# Patient Record
Sex: Female | Born: 1965
Health system: Southern US, Community
[De-identification: ages and names within clinical notes are randomized; demographics above are authoritative.]

## PROBLEM LIST (undated history)

## (undated) DIAGNOSIS — C801 Malignant (primary) neoplasm, unspecified: Secondary | ICD-10-CM

## (undated) DIAGNOSIS — E079 Disorder of thyroid, unspecified: Secondary | ICD-10-CM

## (undated) DIAGNOSIS — I10 Essential (primary) hypertension: Secondary | ICD-10-CM

## (undated) HISTORY — PX: THYROIDECTOMY: SHX17

## (undated) HISTORY — PX: APPENDECTOMY: SHX54

---

## 2003-06-07 ENCOUNTER — Emergency Department (HOSPITAL_COMMUNITY): Admission: EM | Admit: 2003-06-07 | Discharge: 2003-06-07 | Payer: Self-pay | Admitting: Emergency Medicine

## 2007-03-02 ENCOUNTER — Ambulatory Visit (HOSPITAL_COMMUNITY): Admission: RE | Admit: 2007-03-02 | Discharge: 2007-03-02 | Payer: Self-pay | Admitting: Obstetrics and Gynecology

## 2007-04-01 ENCOUNTER — Ambulatory Visit (HOSPITAL_COMMUNITY): Admission: RE | Admit: 2007-04-01 | Discharge: 2007-04-01 | Payer: Self-pay | Admitting: Obstetrics and Gynecology

## 2007-07-01 ENCOUNTER — Ambulatory Visit (HOSPITAL_COMMUNITY): Admission: RE | Admit: 2007-07-01 | Discharge: 2007-07-01 | Payer: Self-pay | Admitting: Pulmonary Disease

## 2007-07-01 ENCOUNTER — Ambulatory Visit (HOSPITAL_COMMUNITY): Admission: RE | Admit: 2007-07-01 | Discharge: 2007-07-01 | Payer: Self-pay | Admitting: Obstetrics and Gynecology

## 2008-04-07 ENCOUNTER — Other Ambulatory Visit: Admission: RE | Admit: 2008-04-07 | Discharge: 2008-04-07 | Payer: Self-pay | Admitting: Obstetrics and Gynecology

## 2008-04-08 ENCOUNTER — Ambulatory Visit (HOSPITAL_COMMUNITY): Admission: RE | Admit: 2008-04-08 | Discharge: 2008-04-08 | Payer: Self-pay | Admitting: Obstetrics & Gynecology

## 2008-04-18 ENCOUNTER — Ambulatory Visit (HOSPITAL_COMMUNITY): Admission: RE | Admit: 2008-04-18 | Discharge: 2008-04-18 | Payer: Self-pay | Admitting: Obstetrics & Gynecology

## 2008-04-25 ENCOUNTER — Encounter (HOSPITAL_COMMUNITY): Admission: RE | Admit: 2008-04-25 | Discharge: 2008-05-25 | Payer: Self-pay | Admitting: Endocrinology

## 2008-06-01 ENCOUNTER — Other Ambulatory Visit: Payer: Self-pay

## 2008-06-01 ENCOUNTER — Ambulatory Visit: Payer: Self-pay | Admitting: Otolaryngology

## 2008-06-09 ENCOUNTER — Ambulatory Visit: Payer: Self-pay | Admitting: Otolaryngology

## 2008-07-19 ENCOUNTER — Inpatient Hospital Stay: Payer: Self-pay | Admitting: Endocrinology

## 2009-12-18 ENCOUNTER — Encounter (HOSPITAL_COMMUNITY): Admission: RE | Admit: 2009-12-18 | Discharge: 2010-02-19 | Payer: Self-pay | Admitting: Endocrinology

## 2010-08-07 ENCOUNTER — Ambulatory Visit (HOSPITAL_COMMUNITY): Admission: RE | Admit: 2010-08-07 | Discharge: 2010-08-07 | Payer: Self-pay | Admitting: Pulmonary Disease

## 2011-03-27 ENCOUNTER — Other Ambulatory Visit (HOSPITAL_COMMUNITY): Payer: Self-pay | Admitting: "Endocrinology

## 2011-03-27 DIAGNOSIS — Z85850 Personal history of malignant neoplasm of thyroid: Secondary | ICD-10-CM

## 2011-03-28 ENCOUNTER — Other Ambulatory Visit (HOSPITAL_COMMUNITY): Payer: Self-pay | Admitting: "Endocrinology

## 2011-03-28 DIAGNOSIS — C73 Malignant neoplasm of thyroid gland: Secondary | ICD-10-CM

## 2011-04-03 ENCOUNTER — Encounter (HOSPITAL_COMMUNITY)
Admission: RE | Admit: 2011-04-03 | Discharge: 2011-04-03 | Disposition: A | Discharge status: Home / Self Care | Payer: 59 | Source: Ambulatory Visit | Attending: "Endocrinology | Admitting: "Endocrinology

## 2011-04-03 ENCOUNTER — Encounter (HOSPITAL_COMMUNITY): Payer: Self-pay

## 2011-04-03 DIAGNOSIS — Z09 Encounter for follow-up examination after completed treatment for conditions other than malignant neoplasm: Secondary | ICD-10-CM | POA: Insufficient documentation

## 2011-04-03 DIAGNOSIS — C73 Malignant neoplasm of thyroid gland: Secondary | ICD-10-CM

## 2011-04-03 DIAGNOSIS — Z85850 Personal history of malignant neoplasm of thyroid: Secondary | ICD-10-CM | POA: Insufficient documentation

## 2011-04-03 HISTORY — DX: Malignant (primary) neoplasm, unspecified: C80.1

## 2011-04-03 HISTORY — DX: Essential (primary) hypertension: I10

## 2011-04-03 MED ORDER — SODIUM IODIDE I 131 CAPSULE: 3.0000 | Freq: Once | INTRAVENOUS | Status: DC | PRN

## 2011-04-04 ENCOUNTER — Ambulatory Visit (HOSPITAL_COMMUNITY): Admission: RE | Admit: 2011-04-04 | Payer: 59 | Source: Ambulatory Visit

## 2011-04-05 ENCOUNTER — Encounter (HOSPITAL_COMMUNITY): Payer: 59

## 2011-04-05 ENCOUNTER — Encounter (HOSPITAL_COMMUNITY)
Admission: RE | Admit: 2011-04-05 | Discharge: 2011-04-05 | Disposition: A | Discharge status: Home / Self Care | Payer: 59 | Source: Ambulatory Visit | Attending: "Endocrinology | Admitting: "Endocrinology

## 2011-04-05 DIAGNOSIS — Z85850 Personal history of malignant neoplasm of thyroid: Secondary | ICD-10-CM

## 2011-04-08 ENCOUNTER — Encounter (HOSPITAL_COMMUNITY)
Admission: RE | Admit: 2011-04-08 | Discharge: 2011-04-08 | Payer: 59 | Source: Ambulatory Visit | Attending: "Endocrinology | Admitting: "Endocrinology

## 2011-04-08 MED ORDER — SODIUM IODIDE I 131 CAPSULE: 3.0000 | Freq: Once | INTRAVENOUS | Status: AC | PRN

## 2011-05-03 NOTE — Procedures (Signed)
   NAME:  Dana White, Dana White                     ACCOUNT NO.:  [id number]   MEDICAL RECORD NO.:  [id number]                   PATIENT TYPE:  EMS   LOCATION:  ED                                   FACILITY:  [facility]   PHYSICIAN:  Edward L. Juanetta Gosling, M.D.             DATE OF BIRTH:  [birth year]   DATE OF PROCEDURE:  DATE OF DISCHARGE:  06/07/2003                                EKG INTERPRETATION   ELECTROCARDIOGRAM INTERPRETATION:  At 11:51 on June 07, 2003.  The rhythm is  sinus rhythm with a rate in the 70s.  There are nonspecific ST/T wave  changes inferiorly.  Abnormal electrocardiogram.                                                Oneal Deputy. Juanetta Gosling, M.D.    ELH/MEDQ  D:  06/22/2003  T:  06/23/2003  Job:  [id number]

## 2012-04-22 ENCOUNTER — Emergency Department (HOSPITAL_COMMUNITY)
Admission: EM | Admit: 2012-04-22 | Discharge: 2012-04-22 | Disposition: A | Discharge status: Home / Self Care | Payer: 59 | Attending: Emergency Medicine | Admitting: Emergency Medicine

## 2012-04-22 ENCOUNTER — Encounter (HOSPITAL_COMMUNITY): Payer: Self-pay

## 2012-04-22 DIAGNOSIS — F17200 Nicotine dependence, unspecified, uncomplicated: Secondary | ICD-10-CM | POA: Insufficient documentation

## 2012-04-22 DIAGNOSIS — R05 Cough: Secondary | ICD-10-CM | POA: Insufficient documentation

## 2012-04-22 DIAGNOSIS — K5900 Constipation, unspecified: Secondary | ICD-10-CM | POA: Insufficient documentation

## 2012-04-22 DIAGNOSIS — R42 Dizziness and giddiness: Secondary | ICD-10-CM

## 2012-04-22 DIAGNOSIS — R059 Cough, unspecified: Secondary | ICD-10-CM | POA: Insufficient documentation

## 2012-04-22 DIAGNOSIS — R0602 Shortness of breath: Secondary | ICD-10-CM | POA: Insufficient documentation

## 2012-04-22 DIAGNOSIS — R002 Palpitations: Secondary | ICD-10-CM | POA: Insufficient documentation

## 2012-04-22 HISTORY — DX: Disorder of thyroid, unspecified: E07.9

## 2012-04-22 LAB — DIFFERENTIAL
Basophils Relative: 0 % (ref 0–1)
Eosinophils Absolute: 0.2 10*3/uL (ref 0.0–0.7)
Eosinophils Relative: 2 % (ref 0–5)
Lymphocytes Relative: 34 % (ref 12–46)
Monocytes Absolute: 0.6 10*3/uL (ref 0.1–1.0)
Monocytes Relative: 6 % (ref 3–12)

## 2012-04-22 LAB — TSH: TSH: 0.682 u[IU]/mL (ref 0.350–4.500)

## 2012-04-22 LAB — CBC
Hemoglobin: 15.3 g/dL — ABNORMAL HIGH (ref 12.0–15.0)
MCHC: 34.3 g/dL (ref 30.0–36.0)
MCV: 90.1 fL (ref 78.0–100.0)
RBC: 4.95 MIL/uL (ref 3.87–5.11)
WBC: 10.6 10*3/uL — ABNORMAL HIGH (ref 4.0–10.5)

## 2012-04-22 LAB — COMPREHENSIVE METABOLIC PANEL
ALT: 16 U/L (ref 0–35)
AST: 16 U/L (ref 0–37)
Calcium: 10.3 mg/dL (ref 8.4–10.5)
Chloride: 100 mEq/L (ref 96–112)
GFR calc Af Amer: >90 mL/min (ref >90)
GFR calc non Af Amer: >90 mL/min (ref >90)
Sodium: 138 mEq/L (ref 135–145)

## 2012-04-22 LAB — T4, FREE: Free T4: 1.78 ng/dL (ref 0.80–1.80)

## 2012-04-22 LAB — POCT I-STAT TROPONIN I

## 2012-04-22 MED ORDER — MECLIZINE HCL 25 MG PO TABS: ORAL_TABLET | ORAL | Status: DC

## 2012-04-22 MED ORDER — MECLIZINE HCL 12.5 MG PO TABS
25.0000 mg | ORAL_TABLET | Freq: Once | ORAL | Status: AC
  Administered 2012-04-22: 25 mg via ORAL
  Filled 2012-04-22: qty 2

## 2012-04-22 NOTE — ED Notes (Addendum)
Pt c/o dizziness since Friday.  Also says feels like heart beat is "off."  Says heart will beat normally then feels a hard beat.  Reports had thyroidectomy and her PCP decreased her thyroid medication in Jan.  Also says arms and legs feel tingly.

## 2012-04-22 NOTE — ED Provider Notes (Signed)
History   This chart was scribed for Ward Givens, MD by Sofie Rower. The patient was seen in room [facility]12[facility]12 and the patient's care was started at 7:52 AM     CSN: [id number]  Arrival date & time 04/22/12  4098   First MD Initiated Contact with Patient 04/22/12 [phone]      Chief Complaint  Patient presents with  . Dizziness    (Consider location/radiation/quality/duration/timing/severity/associated sxs/prior treatment) HPI  Dana White is a 46 y.o. female who presents to the Emergency Department complaining of dizziness onset five days ago with associated symptoms sinus tenderness, cough, shortness of breath, constipation, hair falling out. The pt states she was driving to work Friday morning, where she began to feel dizzy. Pt informs EDP that "the episodes last for a few seconds." The pt states "I feel like my heartbeat is abnormal." She states she feels her heart beating normally in she feels a hard beat then a pause and it goes to normal.  The pt also states she is having hot flashes which she's had for a while with menopause. Modifying factors include certain body positions and quick movements of the head which intensify the dizziness. Pt has a hx visiting with Endocrinologist in January, 2013 where her thyroid medication was lowered from 168 to 150 mcg a day because her TSH was 1.9 and her endocrinologist wanted her to be 1.8. Her next appointment is in July. She has not had her thyroid level checked since the dose was changed.. Patient states she started not feeling well and about a month after her dose of thyroid supplement was changed Pt has a hx of a tick being removed by her husband a few weejs ago without fever, lesions or joint pains. Pt also has a hx of papillary thyroid cancer for which she has a hx of surgery four years ago this June, familial hx of breast cancer (mothers sister), and a hx of acid reflux disease.    Pt denies headache, chest pain, shortness of breath, familial  hx of heart disease, nausea, vomiting, diarrhea fever.   Pt is a smoker.   PCP is Dr. Juanetta Gosling  Endocrinologist is Dr. Fransico Him.    Past Medical History  Diagnosis Date  . Hypertension   . Cancer     papillary  . Thyroid cancer     Past Surgical History  Procedure Date  . Thyroidectomy      History  Substance Use Topics  . Smoking status: Current Everyday Smoker  . Smokeless tobacco: Not on file  . Alcohol Use: No   employed Lives with spouse at home  OB History    Grav Para Term Preterm Abortions TAB SAB Ect Mult Living                  Review of Systems  All other systems reviewed and are negative.    10 Systems reviewed and all are negative for acute change except as noted in the HPI.    Allergies  Review of patient's allergies indicates no known allergies.  Home Medications   Current Outpatient Rx  Name Route Sig Dispense Refill  . DEXLANSOPRAZOLE 30 MG PO CPDR Oral Take 30 mg by mouth daily.    Marland Kitchen LEVOTHYROXINE SODIUM 150 MCG PO TABS Oral Take 150 mcg by mouth daily.    Marland Kitchen LISINOPRIL 5 MG PO TABS Oral Take 5 mg by mouth daily.      BP 129/90  Pulse 92  Temp(Src) 98.9  F (37.2 C) (Oral)  Resp 18  Ht 5' (1.524 m)  Wt 142 lb (64.411 kg)  BMI 27.73 kg/m2  SpO2 98%  Vital signs normal    Physical Exam  Nursing note and vitals reviewed. Constitutional: She is oriented to person, place, and time. She appears well-developed and well-nourished. No distress.  HENT:  Head: Normocephalic and atraumatic.  Right Ear: External ear normal.  Left Ear: External ear normal.  Nose: Nose normal.  Mouth/Throat: Oropharynx is clear and moist.  Eyes: Conjunctivae and EOM are normal. Pupils are equal, round, and reactive to light.  Neck: Normal range of motion. Neck supple.  Cardiovascular: Normal rate, regular rhythm and normal heart sounds.   Pulmonary/Chest: Effort normal and breath sounds normal. No respiratory distress. She has no wheezes. She has no rales.   Musculoskeletal: Normal range of motion. She exhibits no edema and no tenderness.  Neurological: She is alert and oriented to person, place, and time.  Skin: Skin is warm and dry.  Psychiatric: She has a normal mood and affect. Her behavior is normal.    ED Course  Procedures (including critical care time)  Medications  meclizine (ANTIVERT) tablet 25 mg (25 mg Oral Given 04/22/12 0928)      8:05AM- EDP at bedside discusses treatment plan.   9:15AM- Recheck. EDP at bedside discusses treatment plan. Patient questions whether she should try meclizine over-the-counter to see if that would work. Patient was given meclizine 25 mg orally in the ER. Patient relieved that her cardiac tests were normal.  10:09 AM- Recheck. EDP at bedside discusses treatment plan concerning prescription for anivert, and thyroid testing. Pt has had the antivert and is feeling better. Sitting on the edge of her stretcher fully closed and appears to feel better.   DIAGNOSTIC STUDIES: Oxygen Saturation is 98% on room air, normal by my interpretation.    COORDINATION OF CARE:    Results for orders placed during the hospital encounter of 04/22/12  CBC      Component Value Range   WBC 10.6 (*) 4.0 - 10.5 (K/uL)   RBC 4.95  3.87 - 5.11 (MIL/uL)   Hemoglobin 15.3 (*) 12.0 - 15.0 (g/dL)   HCT 47.8  29.5 - 62.1 (%)   MCV 90.1  78.0 - 100.0 (fL)   MCH 30.9  26.0 - 34.0 (pg)   MCHC 34.3  30.0 - 36.0 (g/dL)   RDW 30.8  65.7 - 84.6 (%)   Platelets 295  150 - 400 (K/uL)  DIFFERENTIAL      Component Value Range   Neutrophils Relative 58  43 - 77 (%)   Neutro Abs 6.2  1.7 - 7.7 (K/uL)   Lymphocytes Relative 34  12 - 46 (%)   Lymphs Abs 3.6  0.7 - 4.0 (K/uL)   Monocytes Relative 6  3 - 12 (%)   Monocytes Absolute 0.6  0.1 - 1.0 (K/uL)   Eosinophils Relative 2  0 - 5 (%)   Eosinophils Absolute 0.2  0.0 - 0.7 (K/uL)   Basophils Relative 0  0 - 1 (%)   Basophils Absolute 0.0  0.0 - 0.1 (K/uL)  COMPREHENSIVE  METABOLIC PANEL      Component Value Range   Sodium 138  135 - 145 (mEq/L)   Potassium 4.0  3.5 - 5.1 (mEq/L)   Chloride 100  96 - 112 (mEq/L)   CO2 26  19 - 32 (mEq/L)   Glucose, Bld 97  70 - 99 (mg/dL)  BUN 8  6 - 23 (mg/dL)   Creatinine, Ser 1.61  0.50 - 1.10 (mg/dL)   Calcium 09.6  8.4 - 10.5 (mg/dL)   Total Protein 7.7  6.0 - 8.3 (g/dL)   Albumin 4.1  3.5 - 5.2 (g/dL)   AST 16  0 - 37 (U/L)   ALT 16  0 - 35 (U/L)   Alkaline Phosphatase 117  39 - 117 (U/L)   Total Bilirubin 0.2 (*) 0.3 - 1.2 (mg/dL)   GFR calc non Af Amer >90  >90 (mL/min)   GFR calc Af Amer >90  >90 (mL/min)  POCT I-STAT TROPONIN I      Component Value Range   Troponin i, poc 0.00  0.00 - 0.08 (ng/mL)   Comment 3            Laboratory interpretation all normal    Date: 04/22/2012  Rate: 78  Rhythm: normal sinus rhythm  QRS Axis: normal  Intervals: normal  ST/T Wave abnormalities: normal  Conduction Disutrbances:none  Narrative Interpretation:   Old EKG Reviewed: none available    1. Dizziness   2. Palpitations     Plan discharge  Devoria Albe, MD, FACEP    MDM   I personally performed the services described in this documentation, which was scribed in my presence. The recorded information has been reviewed and considered. Devoria Albe, MD, Armando Gang    Ward Givens, MD 04/22/12 [phone]

## 2012-04-22 NOTE — Discharge Instructions (Signed)
Take the Antivert as needed for dizziness. Call Dr. Isidoro Donning office to let them know you had thyroid test done today, and the results should be back in the next 2 days. Return to emergency department if you get chest pain, pass out, get headache, or you feel worse.

## 2013-04-20 ENCOUNTER — Other Ambulatory Visit (HOSPITAL_COMMUNITY): Payer: Self-pay | Admitting: "Endocrinology

## 2013-04-20 DIAGNOSIS — C73 Malignant neoplasm of thyroid gland: Secondary | ICD-10-CM

## 2013-04-22 ENCOUNTER — Ambulatory Visit (HOSPITAL_COMMUNITY): Payer: 59

## 2013-04-30 ENCOUNTER — Ambulatory Visit (HOSPITAL_COMMUNITY)
Admission: RE | Admit: 2013-04-30 | Discharge: 2013-04-30 | Disposition: A | Discharge status: Home / Self Care | Payer: 59 | Source: Ambulatory Visit | Attending: "Endocrinology | Admitting: "Endocrinology

## 2013-04-30 DIAGNOSIS — C73 Malignant neoplasm of thyroid gland: Secondary | ICD-10-CM

## 2014-04-22 ENCOUNTER — Other Ambulatory Visit (HOSPITAL_COMMUNITY): Payer: Self-pay | Admitting: "Endocrinology

## 2014-04-22 DIAGNOSIS — C73 Malignant neoplasm of thyroid gland: Secondary | ICD-10-CM

## 2014-05-11 ENCOUNTER — Encounter (HOSPITAL_COMMUNITY): Admission: RE | Admit: 2014-05-11 | Payer: 59 | Source: Ambulatory Visit

## 2014-05-12 ENCOUNTER — Encounter (HOSPITAL_COMMUNITY): Payer: 59

## 2014-05-13 ENCOUNTER — Encounter (HOSPITAL_COMMUNITY): Admission: RE | Admit: 2014-05-13 | Payer: 59 | Source: Ambulatory Visit

## 2014-05-16 ENCOUNTER — Encounter (HOSPITAL_COMMUNITY): Payer: 59

## 2014-05-24 ENCOUNTER — Other Ambulatory Visit (HOSPITAL_COMMUNITY): Payer: Self-pay | Admitting: "Endocrinology

## 2014-05-24 DIAGNOSIS — C73 Malignant neoplasm of thyroid gland: Secondary | ICD-10-CM

## 2014-06-06 ENCOUNTER — Encounter (HOSPITAL_COMMUNITY)
Admission: RE | Admit: 2014-06-06 | Discharge: 2014-06-06 | Disposition: A | Discharge status: Home / Self Care | Payer: 59 | Source: Ambulatory Visit | Attending: "Endocrinology | Admitting: "Endocrinology

## 2014-06-06 ENCOUNTER — Encounter (HOSPITAL_COMMUNITY): Payer: Self-pay

## 2014-06-06 DIAGNOSIS — C73 Malignant neoplasm of thyroid gland: Secondary | ICD-10-CM | POA: Insufficient documentation

## 2014-06-06 MED ORDER — THYROTROPIN ALFA 1.1 MG IM SOLR
0.9000 mg | INTRAMUSCULAR | Status: AC
  Administered 2014-06-06: 0.9 mg via INTRAMUSCULAR

## 2014-06-06 MED ORDER — THYROTROPIN ALFA 1.1 MG IM SOLR
INTRAMUSCULAR | Status: AC
  Administered 2014-06-06: 0.9 mg via INTRAMUSCULAR
  Filled 2014-06-06: qty 0.9

## 2014-06-07 ENCOUNTER — Encounter (HOSPITAL_COMMUNITY)
Admission: RE | Admit: 2014-06-07 | Discharge: 2014-06-07 | Disposition: A | Discharge status: Home / Self Care | Payer: 59 | Source: Ambulatory Visit | Attending: "Endocrinology | Admitting: "Endocrinology

## 2014-06-07 DIAGNOSIS — C73 Malignant neoplasm of thyroid gland: Secondary | ICD-10-CM

## 2014-06-07 LAB — HCG, SERUM, QUALITATIVE: Preg, Serum: NEGATIVE

## 2014-06-07 MED ORDER — THYROTROPIN ALFA 1.1 MG IM SOLR
0.9000 mg | INTRAMUSCULAR | Status: AC
  Administered 2014-06-07: 0.9 mg via INTRAMUSCULAR

## 2014-06-08 ENCOUNTER — Encounter (HOSPITAL_COMMUNITY): Payer: Self-pay

## 2014-06-08 ENCOUNTER — Encounter (HOSPITAL_COMMUNITY)
Admission: RE | Admit: 2014-06-08 | Discharge: 2014-06-08 | Disposition: A | Discharge status: Home / Self Care | Payer: 59 | Source: Ambulatory Visit | Attending: "Endocrinology | Admitting: "Endocrinology

## 2014-06-08 MED ORDER — SODIUM IODIDE I 131 CAPSULE
4.0000 | Freq: Once | INTRAVENOUS | Status: AC | PRN
  Administered 2014-06-08: 4 via ORAL

## 2014-06-10 ENCOUNTER — Encounter (HOSPITAL_COMMUNITY)
Admission: RE | Admit: 2014-06-10 | Discharge: 2014-06-10 | Disposition: A | Discharge status: Home / Self Care | Payer: 59 | Source: Ambulatory Visit | Attending: "Endocrinology | Admitting: "Endocrinology

## 2015-09-15 ENCOUNTER — Ambulatory Visit (INDEPENDENT_AMBULATORY_CARE_PROVIDER_SITE_OTHER): Payer: Self-pay | Admitting: "Endocrinology

## 2015-09-15 ENCOUNTER — Encounter: Payer: Self-pay | Admitting: "Endocrinology

## 2015-09-15 VITALS — BP 126/83 | HR 78 | Ht 60.0 in | Wt 148.0 lb

## 2015-09-15 DIAGNOSIS — E890 Postprocedural hypothyroidism: Secondary | ICD-10-CM | POA: Insufficient documentation

## 2015-09-15 DIAGNOSIS — C73 Malignant neoplasm of thyroid gland: Secondary | ICD-10-CM

## 2015-09-15 MED ORDER — LEVOTHYROXINE SODIUM 137 MCG PO TABS: 137.0000 ug | ORAL_TABLET | Freq: Every day | ORAL | Status: DC

## 2015-09-15 NOTE — Progress Notes (Signed)
Subjective:    Patient ID: Dana White, female    DOB: 1966-01-23,    Past Medical History  Diagnosis Date  . Hypertension   . Cancer     papillary  . Thyroid disease    Past Surgical History  Procedure Laterality Date  . Appendectomy    . Thyroidectomy     Social History   Social History  . Marital Status: Married    Spouse Name: N/A  . Number of Children: N/A  . Years of Education: N/A   Social History Main Topics  . Smoking status: Current Every Day Smoker  . Smokeless tobacco: None  . Alcohol Use: No  . Drug Use: No  . Sexual Activity: Not Asked   Other Topics Concern  . None   Social History Narrative   Outpatient Encounter Prescriptions as of 09/15/2015  Medication Sig  . levothyroxine (SYNTHROID, LEVOTHROID) 137 MCG tablet Take 1 tablet (137 mcg total) by mouth daily before breakfast.  . lisinopril (PRINIVIL,ZESTRIL) 5 MG tablet Take 5 mg by mouth daily.  . [DISCONTINUED] levothyroxine (SYNTHROID, LEVOTHROID) 137 MCG tablet Take 137 mcg by mouth daily before breakfast.  . [DISCONTINUED] Dexlansoprazole (DEXILANT) 30 MG capsule Take 30 mg by mouth daily.  . [DISCONTINUED] levothyroxine (SYNTHROID, LEVOTHROID) 150 MCG tablet Take 150 mcg by mouth daily.  . [DISCONTINUED] meclizine (ANTIVERT) 25 MG tablet Take 1 or 2 po Q 6hrs for dizziness   No facility-administered encounter medications on file as of 09/15/2015.   ALLERGIES: No Known Allergies VACCINATION STATUS:  There is no immunization history on file for this patient.  HPI  49 yo female with medical h/o PTC s/p near total thyroidectomy in June 2009. and I-131 therapy 153mi on 07/20/2008. She is currently on Synthroid 137 mcg po qam. She maintains compliance.  Her recent ( June 2015) as well as prior rTSH WBS was negative for any evidence of thyroid cancer in October 2012 and her most recent thyroid u/s in May 2014 is negative for any residual thyroid tissue. she did not keep her last  appointment . no interval issues. she returns for f/u of her hypothyroidism. she tries to quit smoking.  Review of Systems  Constitutional: no weight gain/loss, no fatigue, no subjective hyperthermia/hypothermia Eyes: no blurry vision, no xerophthalmia ENT: no sore throat, no nodules palpated in throat, no dysphagia/odynophagia, no hoarseness Cardiovascular: no CP/SOB/palpitations/leg swelling Respiratory: no cough/SOB Gastrointestinal: no N/V/D/C Musculoskeletal: no muscle/joint aches Skin: no rashes Neurological: no tremors/numbness/tingling/dizziness Psychiatric: no depression/anxiety  Objective:    BP 126/83 mmHg  Pulse 78  Ht 5' (1.524 m)  Wt 148 lb (67.132 kg)  BMI 28.90 kg/m2  SpO2 98%  Wt Readings from Last 3 Encounters:  09/15/15 148 lb (67.132 kg)  04/22/12 142 lb (64.411 kg)    Physical Exam  Constitutional: overweight, in NAD Eyes: PERRLA, EOMI, no exophthalmos ENT: moist mucous membranes, + thyroidectomy scar , no cervical lymphadenopathy Cardiovascular: RRR, No MRG Respiratory: CTA B Gastrointestinal: abdomen soft, NT, ND, BS+ Musculoskeletal: no deformities, strength intact in all 4 Skin: moist, warm, no rashes Neurological: no tremor with outstretched hands, DTR normal in all 4  Results for orders placed or performed during the hospital encounter of 06/07/14  hCG, serum, qualitative  Result Value Ref Range   Preg, Serum NEGATIVE NEGATIVE   Complete Blood Count (Most recent): Lab Results  Component Value Date   WBC 10.6* 04/22/2012   HGB 15.3* 04/22/2012   HCT 44.6 04/22/2012  MCV 90.1 04/22/2012   PLT 295 04/22/2012   Chemistry (most recent): Lab Results  Component Value Date   NA 138 04/22/2012   K 4.0 04/22/2012   CL 100 04/22/2012   CO2 26 04/22/2012   BUN 8 04/22/2012   CREATININE 0.51 04/22/2012   Diabetic Labs (most recent): No results found for: HGBA1C Lipid profile (most recent): No results found for: TRIG, CHOL        Assessment & Plan:   1. Hypothyroidism associated with surgical procedure  TFTs are now consistent with appropriate replacement. I will continue  Synthroid  137 mcg po am.  I counseled her on the need on compliance to medication and f/u. She will RTN in 6 months with repeat TFTs.  - TSH - T4, free  2. Malignant neoplasm of thyroid gland  Her recent  thyrogen stimulated WBS is negative for tumor recurrence.Stimulated TG level is 0.2 ( 2.8-40.9). Her last thyroid u/s was negative for any residual thyroid tissue. she may need repeat neck/thyroid ultrasound in 1 yr.   Follow up plan: Return in about 6 months (around 03/14/2016) for underactive thyroid, thyroid cancer.  Glade Lloyd, MD Phone: [phone]  Fax: [phone]   09/15/2015, 8:56 PM

## 2015-11-13 ENCOUNTER — Other Ambulatory Visit: Payer: Self-pay

## 2015-11-13 MED ORDER — LEVOTHYROXINE SODIUM 137 MCG PO TABS: 137.0000 ug | ORAL_TABLET | Freq: Every day | ORAL | Status: DC

## 2016-03-07 ENCOUNTER — Other Ambulatory Visit: Payer: Self-pay | Admitting: "Endocrinology

## 2016-03-07 LAB — T4, FREE: FREE T4: 1.8 ng/dL (ref 0.8–1.8)

## 2016-03-07 LAB — TSH: TSH: 0.77 mIU/L

## 2016-03-14 ENCOUNTER — Encounter: Payer: Self-pay | Admitting: "Endocrinology

## 2016-03-14 ENCOUNTER — Ambulatory Visit (INDEPENDENT_AMBULATORY_CARE_PROVIDER_SITE_OTHER): Payer: Self-pay | Admitting: "Endocrinology

## 2016-03-14 VITALS — BP 134/83 | HR 77 | Ht 60.0 in | Wt 147.0 lb

## 2016-03-14 DIAGNOSIS — C73 Malignant neoplasm of thyroid gland: Secondary | ICD-10-CM

## 2016-03-14 DIAGNOSIS — E890 Postprocedural hypothyroidism: Secondary | ICD-10-CM

## 2016-03-14 MED ORDER — LEVOTHYROXINE SODIUM 137 MCG PO TABS: 137.0000 ug | ORAL_TABLET | Freq: Every day | ORAL | Status: DC

## 2016-03-14 NOTE — Progress Notes (Signed)
Subjective:    Patient ID: Dana White, female    DOB: 1966/01/21,    Past Medical History  Diagnosis Date  . Hypertension   . Cancer (HCC)     papillary  . Thyroid disease    Past Surgical History  Procedure Laterality Date  . Appendectomy    . Thyroidectomy     Social History   Social History  . Marital Status: Married    Spouse Name: N/A  . Number of Children: N/A  . Years of Education: N/A   Social History Main Topics  . Smoking status: Current Every Day Smoker  . Smokeless tobacco: None  . Alcohol Use: No  . Drug Use: No  . Sexual Activity: Not Asked   Other Topics Concern  . None   Social History Narrative   Outpatient Encounter Prescriptions as of 03/14/2016  Medication Sig  . acetaminophen (TYLENOL) 500 MG tablet Take 500 mg by mouth every 6 (six) hours as needed.  Marland Kitchen levothyroxine (SYNTHROID, LEVOTHROID) 137 MCG tablet Take 1 tablet (137 mcg total) by mouth daily before breakfast.  . lisinopril (PRINIVIL,ZESTRIL) 5 MG tablet Take 5 mg by mouth daily.  . [DISCONTINUED] levothyroxine (SYNTHROID, LEVOTHROID) 137 MCG tablet Take 1 tablet (137 mcg total) by mouth daily before breakfast.   No facility-administered encounter medications on file as of 03/14/2016.   ALLERGIES: No Known Allergies VACCINATION STATUS:  There is no immunization history on file for this patient.  HPI  50 yo female with medical h/o PTC s/p near total thyroidectomy in June 2009. and I-131 therapy 133mi on 07/20/2008. She is currently on Synthroid 137 mcg po qam. She maintains compliance.  Her recent ( June 2015) as well as prior rTSH WBS was negative for any evidence of thyroid cancer in October 2012 and her most recent thyroid u/s in May 2014 is negative for any residual thyroid tissue. she did not keep her last appointment . no interval issues. she returns for f/u of her hypothyroidism. she tries to quit smoking.  Review of Systems  Constitutional: no weight gain/loss,  no fatigue, no subjective hyperthermia/hypothermia Eyes: no blurry vision, no xerophthalmia ENT: no sore throat, no nodules palpated in throat, no dysphagia/odynophagia, no hoarseness Cardiovascular: no CP/SOB/palpitations/leg swelling Respiratory: no cough/SOB Gastrointestinal: no N/V/D/C Musculoskeletal: no muscle/joint aches Skin: no rashes Neurological: no tremors/numbness/tingling/dizziness Psychiatric: no depression/anxiety  Objective:    BP 134/83 mmHg  Pulse 77  Ht 5' (1.524 m)  Wt 147 lb (66.679 kg)  BMI 28.71 kg/m2  SpO2 100%  Wt Readings from Last 3 Encounters:  03/14/16 147 lb (66.679 kg)  09/15/15 148 lb (67.132 kg)  04/22/12 142 lb (64.411 kg)    Physical Exam  Constitutional: overweight, in NAD Eyes: PERRLA, EOMI, no exophthalmos ENT: moist mucous membranes, + thyroidectomy scar , no cervical lymphadenopathy Cardiovascular: RRR, No MRG Respiratory: CTA B Gastrointestinal: abdomen soft, NT, ND, BS+ Musculoskeletal: no deformities, strength intact in all 4 Skin: moist, warm, no rashes Neurological: no tremor with outstretched hands, DTR normal in all 4  Results for orders placed or performed in visit on 03/07/16  TSH  Result Value Ref Range   TSH 0.77 mIU/L  T4, free  Result Value Ref Range   Free T4 1.8 0.8 - 1.8 ng/dL   Complete Blood Count (Most recent): Lab Results  Component Value Date   WBC 10.6* 04/22/2012   HGB 15.3* 04/22/2012   HCT 44.6 04/22/2012   MCV 90.1 04/22/2012   PLT  295 04/22/2012   Chemistry (most recent): Lab Results  Component Value Date   NA 138 04/22/2012   K 4.0 04/22/2012   CL 100 04/22/2012   CO2 26 04/22/2012   BUN 8 04/22/2012   CREATININE 0.51 04/22/2012     Assessment & Plan:   1. Hypothyroidism associated with surgical procedure  TFTs are now consistent with appropriate replacement. I will continue  Synthroid  137 mcg po am.  I counseled her on the need on compliance to medication and f/u. She will RTN  in 6 months with repeat TFTs And thyroid/neck ultrasound.  - TSH - T4, free  2. Malignant neoplasm of thyroid gland  Her recent  thyrogen stimulated WBS is negative for tumor recurrence.Stimulated TG level is 0.2 ( 2.8-40.9). Her last thyroid u/s was negative for any residual thyroid tissue. she may need repeat neck/thyroid ultrasound in 6 months.   Follow up plan: Return in about 6 months (around 09/14/2016) for underactive thyroid, Thyroid Ultrasound, follow up with pre-visit labs.  Glade Lloyd, MD Phone: [phone]  Fax: [phone]   03/14/2016, 4:13 PM

## 2016-06-17 ENCOUNTER — Other Ambulatory Visit: Payer: Self-pay

## 2016-06-17 MED ORDER — LEVOTHYROXINE SODIUM 137 MCG PO TABS: 137.0000 ug | ORAL_TABLET | Freq: Every day | ORAL | Status: DC

## 2016-09-11 ENCOUNTER — Ambulatory Visit (HOSPITAL_COMMUNITY): Payer: Self-pay

## 2016-09-13 ENCOUNTER — Ambulatory Visit: Payer: Self-pay | Admitting: "Endocrinology

## 2016-10-15 ENCOUNTER — Other Ambulatory Visit: Payer: Self-pay | Admitting: "Endocrinology

## 2016-10-15 DIAGNOSIS — C73 Malignant neoplasm of thyroid gland: Secondary | ICD-10-CM

## 2016-10-15 LAB — T4, FREE: Free T4: 1.7 ng/dL (ref 0.8–1.8)

## 2016-10-15 LAB — TSH: TSH: 0.64 m[IU]/L

## 2016-10-30 ENCOUNTER — Ambulatory Visit (INDEPENDENT_AMBULATORY_CARE_PROVIDER_SITE_OTHER): Payer: Self-pay | Admitting: "Endocrinology

## 2016-10-30 ENCOUNTER — Encounter: Payer: Self-pay | Admitting: "Endocrinology

## 2016-10-30 VITALS — BP 131/84 | HR 105 | Ht 60.0 in | Wt 147.0 lb

## 2016-10-30 DIAGNOSIS — C73 Malignant neoplasm of thyroid gland: Secondary | ICD-10-CM

## 2016-10-30 DIAGNOSIS — E890 Postprocedural hypothyroidism: Secondary | ICD-10-CM

## 2016-10-30 MED ORDER — LEVOTHYROXINE SODIUM 137 MCG PO TABS: 137.0000 ug | ORAL_TABLET | Freq: Every day | ORAL | 12 refills | Status: DC

## 2016-10-30 NOTE — Progress Notes (Signed)
Subjective:    Patient ID: Dana White, female    DOB: [birth year],    Past Medical History:  Diagnosis Date  . Cancer (HCC)    papillary  . Hypertension   . Thyroid disease    Past Surgical History:  Procedure Laterality Date  . APPENDECTOMY    . THYROIDECTOMY     Social History   Social History  . Marital status: Married    Spouse name: N/A  . Number of children: N/A  . Years of education: N/A   Social History Main Topics  . Smoking status: Current Every Day Smoker  . Smokeless tobacco: Never Used  . Alcohol use No  . Drug use: No  . Sexual activity: Not Asked   Other Topics Concern  . None   Social History Narrative  . None   Outpatient Encounter Prescriptions as of 10/30/2016  Medication Sig  . cyclobenzaprine (FLEXERIL) 10 MG tablet Take 10 mg by mouth at bedtime.  Marland Kitchen HYDROcodone-acetaminophen (NORCO/VICODIN) 5-325 MG tablet Take 1 tablet by mouth as needed for moderate pain.  Marland Kitchen acetaminophen (TYLENOL) 500 MG tablet Take 500 mg by mouth every 6 (six) hours as needed.  Marland Kitchen levothyroxine (SYNTHROID, LEVOTHROID) 137 MCG tablet Take 1 tablet (137 mcg total) by mouth daily before breakfast.  . lisinopril (PRINIVIL,ZESTRIL) 5 MG tablet Take 5 mg by mouth daily.  . [DISCONTINUED] levothyroxine (SYNTHROID, LEVOTHROID) 137 MCG tablet Take 1 tablet (137 mcg total) by mouth daily before breakfast.   No facility-administered encounter medications on file as of 10/30/2016.    ALLERGIES: No Known Allergies VACCINATION STATUS:  There is no immunization history on file for this patient.  HPI  50 yo female with medical h/o PTC s/p near total thyroidectomy in June 2009. and I-131 therapy 152mi on 07/20/2008. She is currently on Synthroid 137 mcg po qam. She maintains compliance.  Her recent ( June 2015) as well as prior rTSH WBS was negative for any evidence of  thyroid cancer in October 2012 and her most recent thyroid u/s in May 2014 is negative for any  residual thyroid tissue. she did not keep her last appointment  For surveillance thyroid/neck ultrasound due to lack of insurance. no interval issues. She denies dysphagia, shortness of breath, nor voice change. she returns for f/u of her hypothyroidism. she tries to quit smoking.  Review of Systems  Constitutional: no weight gain/loss, no fatigue, no subjective hyperthermia/hypothermia Eyes: no blurry vision, no xerophthalmia ENT: no sore throat, no nodules palpated in throat, no dysphagia/odynophagia, no hoarseness Cardiovascular: no CP/SOB/palpitations/leg swelling Respiratory: no cough/SOB Gastrointestinal: no N/V/D/C Musculoskeletal: no muscle/joint aches Skin: no rashes Neurological: no tremors/numbness/tingling/dizziness Psychiatric: no depression/anxiety  Objective:    BP 131/84   Pulse (!) 105   Ht 5' (1.524 m)   Wt 147 lb (66.7 kg)   BMI 28.71 kg/m   Wt Readings from Last 3 Encounters:  10/30/16 147 lb (66.7 kg)  03/14/16 147 lb (66.7 kg)  09/15/15 148 lb (67.1 kg)    Physical Exam  Constitutional: overweight, in NAD Eyes: PERRLA, EOMI, no exophthalmos ENT: moist mucous membranes, + thyroidectomy scar , no cervical lymphadenopathy Cardiovascular: RRR, No MRG Respiratory: CTA B Gastrointestinal: abdomen soft, NT, ND, BS+ Musculoskeletal: no deformities, strength intact in all 4 Skin: moist, warm, no rashes Neurological: no tremor with outstretched hands, DTR normal in all 4  Results for orders placed or performed in visit on 10/15/16  TSH  Result Value Ref Range  TSH 0.64 mIU/L  T4, Free  Result Value Ref Range   Free T4 1.7 0.8 - 1.8 ng/dL   Complete Blood Count (Most recent): Lab Results  Component Value Date   WBC 10.6 (H) 04/22/2012   HGB 15.3 (H) 04/22/2012   HCT 44.6 04/22/2012   MCV 90.1 04/22/2012   PLT 295 04/22/2012   Chemistry (most recent): Lab Results  Component Value Date   NA 138 04/22/2012   K 4.0 04/22/2012   CL 100 04/22/2012    CO2 26 04/22/2012   BUN 8 04/22/2012   CREATININE 0.51 04/22/2012     Assessment & Plan:   1. Hypothyroidism : Postsurgical  TFTs are now consistent with appropriate replacement. I will continue  Synthroid  137 mcg po am.   - We discussed about correct intake of levothyroxine, at fasting, with water, separated by at least 30 minutes from breakfast, and separated by more than 4 hours from calcium, iron, multivitamins, acid reflux medications (PPIs). -Patient is made aware of the fact that thyroid hormone replacement is needed for life, dose to be adjusted by periodic monitoring of thyroid function tests.   2. Malignant neoplasm of thyroid gland  Her recent  thyrogen stimulated WBS is negative for tumor recurrence. Stimulated TG level is 0.2 ( 2.8-40.9). Her last thyroid u/s was negative for any residual thyroid tissue. Neck exam is unremarkable, however, she will need repeat neck/thyroid ultrasound, she is trying to get her disability insurance and hopefully she will get it in the next 6 months. I counseled her on the need on compliance to medication and f/u.   Follow up plan: Return in about 6 months (around 04/29/2017) for follow up with pre-visit labs, Thyroid / Neck Ultrasound.  Glade Lloyd, MD Phone: [phone]  Fax: [phone]   10/30/2016, 10:42 AM

## 2016-11-18 ENCOUNTER — Telehealth: Payer: Self-pay

## 2016-11-18 ENCOUNTER — Other Ambulatory Visit: Payer: Self-pay

## 2016-11-18 MED ORDER — LEVOTHYROXINE SODIUM 137 MCG PO TABS: 137.0000 ug | ORAL_TABLET | Freq: Every day | ORAL | 5 refills | Status: DC

## 2016-11-18 NOTE — Telephone Encounter (Signed)
Levothyroxine refill sent to [facility]

## 2016-11-19 ENCOUNTER — Telehealth: Payer: Self-pay

## 2016-11-19 NOTE — Telephone Encounter (Signed)
Pt called stating that she cannot get her levothyroxine from [place]. I called [facility] and they just needed permission to switch manufacturers because one is not available at all. They states they will fill. Pt notified.

## 2016-12-18 ENCOUNTER — Telehealth: Payer: Self-pay | Admitting: "Endocrinology

## 2016-12-18 NOTE — Telephone Encounter (Signed)
She doesn't need a refill right now but for the next time she wants her pharmacy to change from [place] to [facility] in [place]  Thanks!

## 2016-12-19 NOTE — Telephone Encounter (Signed)
Will change to [facility] in chart

## 2017-04-25 ENCOUNTER — Other Ambulatory Visit: Payer: Self-pay | Admitting: "Endocrinology

## 2017-04-25 DIAGNOSIS — E890 Postprocedural hypothyroidism: Secondary | ICD-10-CM

## 2017-04-29 ENCOUNTER — Ambulatory Visit: Payer: Self-pay | Admitting: "Endocrinology

## 2017-05-01 LAB — T4, FREE: FREE T4: 1.9 ng/dL — AB (ref 0.8–1.8)

## 2017-05-01 LAB — TSH: TSH: 0.72 m[IU]/L

## 2017-05-20 ENCOUNTER — Encounter: Payer: Self-pay | Admitting: "Endocrinology

## 2017-05-20 ENCOUNTER — Ambulatory Visit (INDEPENDENT_AMBULATORY_CARE_PROVIDER_SITE_OTHER): Payer: Self-pay | Admitting: "Endocrinology

## 2017-05-20 VITALS — BP 115/74 | HR 76 | Ht 60.0 in | Wt 148.0 lb

## 2017-05-20 DIAGNOSIS — E890 Postprocedural hypothyroidism: Secondary | ICD-10-CM

## 2017-05-20 DIAGNOSIS — C73 Malignant neoplasm of thyroid gland: Secondary | ICD-10-CM

## 2017-05-20 MED ORDER — LEVOTHYROXINE SODIUM 125 MCG PO TABS: 125.0000 ug | ORAL_TABLET | Freq: Every day | ORAL | 6 refills | Status: DC

## 2017-05-20 NOTE — Progress Notes (Signed)
Subjective:    Patient ID: Dana White, female    DOB: June 25, 1966,    Past Medical History:  Diagnosis Date  . Cancer (HCC)    papillary  . Hypertension   . Thyroid disease    Past Surgical History:  Procedure Laterality Date  . APPENDECTOMY    . THYROIDECTOMY     Social History   Social History  . Marital status: Married    Spouse name: N/A  . Number of children: N/A  . Years of education: N/A   Social History Main Topics  . Smoking status: Current Every Day Smoker  . Smokeless tobacco: Never Used  . Alcohol use No  . Drug use: No  . Sexual activity: Not Asked   Other Topics Concern  . None   Social History Narrative  . None   Outpatient Encounter Prescriptions as of 05/20/2017  Medication Sig  . acetaminophen (TYLENOL) 500 MG tablet Take 500 mg by mouth every 6 (six) hours as needed.  . cyclobenzaprine (FLEXERIL) 10 MG tablet Take 10 mg by mouth at bedtime.  Marland Kitchen HYDROcodone-acetaminophen (NORCO/VICODIN) 5-325 MG tablet Take 1 tablet by mouth as needed for moderate pain.  Marland Kitchen levothyroxine (SYNTHROID, LEVOTHROID) 125 MCG tablet Take 1 tablet (125 mcg total) by mouth daily before breakfast.  . lisinopril (PRINIVIL,ZESTRIL) 5 MG tablet Take 5 mg by mouth daily.  . [DISCONTINUED] levothyroxine (SYNTHROID, LEVOTHROID) 137 MCG tablet Take 1 tablet (137 mcg total) by mouth daily before breakfast.   No facility-administered encounter medications on file as of 05/20/2017.    ALLERGIES: No Known Allergies VACCINATION STATUS:  There is no immunization history on file for this patient.  HPI  51 yo female with medical h/o PTC s/p near total thyroidectomy in June 2009. and I-131 therapy 122mi on 07/20/2008. She is currently on Synthroid 137 mcg po qam. She maintains compliance.  Her recent ( June 2015) as well as prior rTSH WBS was negative for any evidence of  thyroid cancer in October 2012 and her most recent thyroid u/s in May 2014 is negative for any residual  thyroid tissue. she did not keep her last appointment  For surveillance thyroid/neck ultrasound due to lack of insurance. no interval issues. She denies dysphagia, shortness of breath, nor voice change. she returns for f/u of her hypothyroidism. she tries to quit smoking.  Review of Systems  Constitutional: no weight gain/loss, no fatigue, no subjective hyperthermia/hypothermia Eyes: no blurry vision, no xerophthalmia ENT: no sore throat, no nodules palpated in throat, no dysphagia/odynophagia, no hoarseness Cardiovascular: no CP/SOB/palpitations/leg swelling Respiratory: no cough/SOB Gastrointestinal: no N/V/D/C Musculoskeletal: no muscle/joint aches Skin: no rashes Neurological: no tremors/numbness/tingling/dizziness Psychiatric: no depression/anxiety  Objective:    BP 115/74   Pulse 76   Ht 5' (1.524 m)   Wt 148 lb (67.1 kg)   BMI 28.90 kg/m   Wt Readings from Last 3 Encounters:  05/20/17 148 lb (67.1 kg)  10/30/16 147 lb (66.7 kg)  03/14/16 147 lb (66.7 kg)    Physical Exam  Constitutional: overweight, in NAD Eyes: PERRLA, EOMI, no exophthalmos ENT: moist mucous membranes, + thyroidectomy scar , no cervical lymphadenopathy Cardiovascular: RRR, No MRG Respiratory: CTA B Gastrointestinal: abdomen soft, NT, ND, BS+ Musculoskeletal: no deformities, strength intact in all 4 Skin: moist, warm, no rashes Neurological: no tremor with outstretched hands, DTR normal in all 4  Results for orders placed or performed in visit on 04/25/17  T4, Free  Result Value Ref Range  Free T4 1.9 (H) 0.8 - 1.8 ng/dL  TSH  Result Value Ref Range   TSH 0.72 mIU/L   Complete Blood Count (Most recent): Lab Results  Component Value Date   WBC 10.6 (H) 04/22/2012   HGB 15.3 (H) 04/22/2012   HCT 44.6 04/22/2012   MCV 90.1 04/22/2012   PLT 295 04/22/2012   Chemistry (most recent): Lab Results  Component Value Date   NA 138 04/22/2012   K 4.0 04/22/2012   CL 100 04/22/2012   CO2 26  04/22/2012   BUN 8 04/22/2012   CREATININE 0.51 04/22/2012     Assessment & Plan:   1. Hypothyroidism : Postsurgical  TFTs are now consistent with Over- replacement. I will  decrease her Synthroid to 125 g by mouth every morning.    - We discussed about correct intake of levothyroxine, at fasting, with water, separated by at least 30 minutes from breakfast, and separated by more than 4 hours from calcium, iron, multivitamins, acid reflux medications (PPIs). -Patient is made aware of the fact that thyroid hormone replacement is needed for life, dose to be adjusted by periodic monitoring of thyroid function tests.   2. Malignant neoplasm of thyroid gland  Her rlast thyrogen stimulated WBS FROM JUNE 2015 was negative for tumor recurrence. Stimulated TG level is 0.2 ( 2.8-40.9). Her last thyroid u/s from May 2014  was negative for any residual thyroid tissue. - She needed subsequent surveillance imaging, however this is delayed due to the fact that she does not have insurance.   Neck exam is unremarkable, however, she will need repeat neck/thyroid ultrasound, she is trying to get her disability insurance and hopefully she will get it in the next 6 months. I counseled her on the need on compliance to medication and f/u.   Follow up plan: Return in about 6 months (around 11/19/2017) for follow up with pre-visit labs, Thyroid / Neck Ultrasound.  Glade Lloyd, MD Phone: [phone]  Fax: [phone]   05/20/2017, 1:27 PM

## 2017-11-14 LAB — TSH: TSH: 1.04 m[IU]/L

## 2017-11-14 LAB — T4, FREE: Free T4: 1.7 ng/dL (ref 0.8–1.8)

## 2017-11-19 ENCOUNTER — Encounter: Payer: Self-pay | Admitting: "Endocrinology

## 2017-11-19 ENCOUNTER — Ambulatory Visit (INDEPENDENT_AMBULATORY_CARE_PROVIDER_SITE_OTHER): Payer: Self-pay | Admitting: "Endocrinology

## 2017-11-19 VITALS — BP 120/85 | HR 91 | Ht 60.0 in | Wt 154.0 lb

## 2017-11-19 DIAGNOSIS — E890 Postprocedural hypothyroidism: Secondary | ICD-10-CM

## 2017-11-19 DIAGNOSIS — C73 Malignant neoplasm of thyroid gland: Secondary | ICD-10-CM

## 2017-11-19 MED ORDER — LEVOTHYROXINE SODIUM 125 MCG PO TABS
125.0000 ug | ORAL_TABLET | Freq: Every day | ORAL | 1 refills | Status: DC
Start: 2017-11-19 — End: 2018-05-20

## 2017-11-19 NOTE — Progress Notes (Signed)
Subjective:    Patient ID: Dana White, female    DOB: 11-06-1966,    Past Medical History:  Diagnosis Date  . Cancer (HCC)    papillary  . Hypertension   . Thyroid disease    Past Surgical History:  Procedure Laterality Date  . APPENDECTOMY    . THYROIDECTOMY     Social History   Socioeconomic History  . Marital status: Married    Spouse name: None  . Number of children: None  . Years of education: None  . Highest education level: None  Social Needs  . Financial resource strain: None  . Food insecurity - worry: None  . Food insecurity - inability: None  . Transportation needs - medical: None  . Transportation needs - non-medical: None  Occupational History  . None  Tobacco Use  . Smoking status: Current Every Day Smoker  . Smokeless tobacco: Never Used  Substance and Sexual Activity  . Alcohol use: No  . Drug use: No  . Sexual activity: None  Other Topics Concern  . None  Social History Narrative  . None   Outpatient Encounter Medications as of 11/19/2017  Medication Sig  . ALPRAZolam (XANAX) 0.25 MG tablet Take 0.25 mg by mouth 2 (two) times daily as needed for anxiety.  Marland Kitchen acetaminophen (TYLENOL) 500 MG tablet Take 500 mg by mouth every 6 (six) hours as needed.  . cyclobenzaprine (FLEXERIL) 10 MG tablet Take 10 mg by mouth at bedtime.  Marland Kitchen HYDROcodone-acetaminophen (NORCO/VICODIN) 5-325 MG tablet Take 1 tablet by mouth as needed for moderate pain.  Marland Kitchen levothyroxine (SYNTHROID, LEVOTHROID) 125 MCG tablet Take 1 tablet (125 mcg total) by mouth daily before breakfast.  . lisinopril (PRINIVIL,ZESTRIL) 5 MG tablet Take 5 mg by mouth daily.  . [DISCONTINUED] levothyroxine (SYNTHROID, LEVOTHROID) 125 MCG tablet Take 1 tablet (125 mcg total) by mouth daily before breakfast.   No facility-administered encounter medications on file as of 11/19/2017.    ALLERGIES: No Known Allergies VACCINATION STATUS:  There is no immunization history on file for this  patient.  HPI  51 yo female with medical h/o PTC s/p near total thyroidectomy in June 2009. and I-131 therapy 163mi on 07/20/2008. She is currently on Synthroid 125 mcg po qam. She maintains compliance.  Her recent ( June 2015) as well as prior rTSH WBS was negative for any evidence of  thyroid cancer in October 2012 and her most recent thyroid u/s in May 2014 is negative for any residual thyroid tissue. she did not keep her last appointment  For surveillance thyroid/neck ultrasound due to lack of insurance. no interval issues. She denies dysphagia, shortness of breath, nor voice change. she returns for f/u of her hypothyroidism. she has successfully quit smoking.  Review of Systems  Constitutional: + weight gain, no fatigue, no subjective hyperthermia/hypothermia Eyes: no blurry vision, no xerophthalmia ENT: no sore throat, no nodules palpated in throat, no dysphagia/odynophagia, no hoarseness Cardiovascular: no CP/SOB/palpitations/leg swelling Respiratory: no cough/SOB Gastrointestinal: no N/V/D/C Musculoskeletal: no muscle/joint aches Skin: no rashes Neurological: no tremors/numbness/tingling/dizziness Psychiatric: no depression/anxiety  Objective:    BP 120/85   Pulse 91   Ht 5' (1.524 m)   Wt 154 lb (69.9 kg)   BMI 30.08 kg/m   Wt Readings from Last 3 Encounters:  11/19/17 154 lb (69.9 kg)  05/20/17 148 lb (67.1 kg)  10/30/16 147 lb (66.7 kg)    Physical Exam  Constitutional: overweight, in NAD Eyes: PERRLA, EOMI, no exophthalmos ENT:  moist mucous membranes, + thyroidectomy scar , no cervical lymphadenopathy Cardiovascular: RRR, No MRG Respiratory: CTA B Gastrointestinal: abdomen soft, NT, ND, BS+ Musculoskeletal: no deformities, strength intact in all 4 Skin: moist, warm, no rashes Neurological: no tremor with outstretched hands, DTR normal in all 4  Results for orders placed or performed in visit on 05/20/17  TSH  Result Value Ref Range   TSH 1.04 mIU/L  T4,  free  Result Value Ref Range   Free T4 1.7 0.8 - 1.8 ng/dL   Complete Blood Count (Most recent): Lab Results  Component Value Date   WBC 10.6 (H) 04/22/2012   HGB 15.3 (H) 04/22/2012   HCT 44.6 04/22/2012   MCV 90.1 04/22/2012   PLT 295 04/22/2012   Chemistry (most recent): Lab Results  Component Value Date   NA 138 04/22/2012   K 4.0 04/22/2012   CL 100 04/22/2012   CO2 26 04/22/2012   BUN 8 04/22/2012   CREATININE 0.51 04/22/2012     Assessment & Plan:   1. Hypothyroidism : Postsurgical  - Thyroid function tests are now consistent with appropriate replacement. I will  continue  Synthroid 125 g by mouth every morning.    - We discussed about correct intake of levothyroxine, at fasting, with water, separated by at least 30 minutes from breakfast, and separated by more than 4 hours from calcium, iron, multivitamins, acid reflux medications (PPIs). -Patient is made aware of the fact that thyroid hormone replacement is needed for life, dose to be adjusted by periodic monitoring of thyroid function tests.  2. Malignant neoplasm of thyroid gland  Her last thyrogen stimulated whole-body scan FROM JUNE 2015 was negative for tumor recurrence. Stimulated TG level is 0.2 ( 2.8-40.9). Her last thyroid u/s from May 2014  was negative for any residual thyroid tissue. - She needed subsequent surveillance imaging, however this is delayed due to the fact that she does not have insurance.   Neck exam is unremarkable, however, she will need repeat neck/thyroid ultrasound, she is trying to get her disability insurance and hopefully she will get it in the next 6 months. Thyroid/neck ultrasound is ordered.    Follow up plan: Return in about 6 months (around 05/20/2018) for follow up with pre-visit labs, Thyroid / Neck Ultrasound.  Glade Lloyd, MD Phone: [phone]  Fax: [phone]  -  This note was partially dictated with voice recognition software. Similar sounding words can be  transcribed inadequately or may not  be corrected upon review.  11/19/2017, 1:30 PM

## 2018-04-30 ENCOUNTER — Ambulatory Visit (HOSPITAL_COMMUNITY)
Admission: RE | Admit: 2018-04-30 | Discharge: 2018-04-30 | Disposition: A | Discharge status: Home / Self Care | Payer: Medicare Other | Source: Ambulatory Visit | Attending: "Endocrinology | Admitting: "Endocrinology

## 2018-04-30 DIAGNOSIS — E049 Nontoxic goiter, unspecified: Secondary | ICD-10-CM | POA: Diagnosis not present

## 2018-04-30 DIAGNOSIS — C73 Malignant neoplasm of thyroid gland: Secondary | ICD-10-CM | POA: Diagnosis not present

## 2018-05-07 DIAGNOSIS — E890 Postprocedural hypothyroidism: Secondary | ICD-10-CM | POA: Diagnosis not present

## 2018-05-08 LAB — TSH: TSH: 0.71 mIU/L

## 2018-05-08 LAB — T4, FREE: Free T4: 1.6 ng/dL (ref 0.8–1.8)

## 2018-05-20 ENCOUNTER — Ambulatory Visit (INDEPENDENT_AMBULATORY_CARE_PROVIDER_SITE_OTHER): Payer: Medicare Other | Admitting: "Endocrinology

## 2018-05-20 ENCOUNTER — Encounter: Payer: Self-pay | Admitting: "Endocrinology

## 2018-05-20 VITALS — BP 135/87 | HR 81 | Ht 61.0 in | Wt 152.0 lb

## 2018-05-20 DIAGNOSIS — E890 Postprocedural hypothyroidism: Secondary | ICD-10-CM

## 2018-05-20 DIAGNOSIS — C73 Malignant neoplasm of thyroid gland: Secondary | ICD-10-CM | POA: Diagnosis not present

## 2018-05-20 MED ORDER — LEVOTHYROXINE SODIUM 125 MCG PO TABS: 125.0000 ug | ORAL_TABLET | Freq: Every day | ORAL | 4 refills | Status: DC

## 2018-05-20 NOTE — Progress Notes (Signed)
Subjective:    Patient ID: Dana White, female    DOB: 1966/06/20,    Past Medical History:  Diagnosis Date  . Cancer (HCC)    papillary  . Hypertension   . Thyroid disease    Past Surgical History:  Procedure Laterality Date  . APPENDECTOMY    . THYROIDECTOMY     Social History   Socioeconomic History  . Marital status: Married    Spouse name: Not on file  . Number of children: Not on file  . Years of education: Not on file  . Highest education level: Not on file  Occupational History  . Not on file  Social Needs  . Financial resource strain: Not on file  . Food insecurity:    Worry: Not on file    Inability: Not on file  . Transportation needs:    Medical: Not on file    Non-medical: Not on file  Tobacco Use  . Smoking status: Current Every Day Smoker  . Smokeless tobacco: Never Used  Substance and Sexual Activity  . Alcohol use: No  . Drug use: No  . Sexual activity: Not on file  Lifestyle  . Physical activity:    Days per week: Not on file    Minutes per session: Not on file  . Stress: Not on file  Relationships  . Social connections:    Talks on phone: Not on file    Gets together: Not on file    Attends religious service: Not on file    Active member of club or organization: Not on file    Attends meetings of clubs or organizations: Not on file    Relationship status: Not on file  Other Topics Concern  . Not on file  Social History Narrative  . Not on file   Outpatient Encounter Medications as of 05/20/2018  Medication Sig  . acetaminophen (TYLENOL) 500 MG tablet Take 500 mg by mouth every 6 (six) hours as needed.  . ALPRAZolam (XANAX) 0.25 MG tablet Take 0.25 mg by mouth 2 (two) times daily as needed for anxiety.  . cyclobenzaprine (FLEXERIL) 10 MG tablet Take 10 mg by mouth at bedtime.  Marland Kitchen HYDROcodone-acetaminophen (NORCO/VICODIN) 5-325 MG tablet Take 1 tablet by mouth as needed for moderate pain.  Marland Kitchen levothyroxine (SYNTHROID,  LEVOTHROID) 125 MCG tablet Take 1 tablet (125 mcg total) by mouth daily before breakfast.  . lisinopril (PRINIVIL,ZESTRIL) 5 MG tablet Take 5 mg by mouth daily.  . [DISCONTINUED] levothyroxine (SYNTHROID, LEVOTHROID) 125 MCG tablet Take 1 tablet (125 mcg total) by mouth daily before breakfast.   No facility-administered encounter medications on file as of 05/20/2018.    ALLERGIES: No Known Allergies VACCINATION STATUS:  There is no immunization history on file for this patient.  HPI  52 yo female with medical h/o PTC s/p near total thyroidectomy in June 2009. and I-131 therapy 135mi on 07/20/2008. She is currently on Synthroid 125 mcg po qam. She maintains compliance.  Her recent ( June 2015) as well as prior rTSH WBS was negative for any evidence of  thyroid cancer in October 2012 and her most recent thyroid u/s in May 2014 is negative for any residual thyroid tissue.  -She has had  surveillance thyroid/neck ultrasound on Apr 30, 2018 which showed surgically absent thyroid, benign-appearing cervical lymphadenopathy.    no interval issues. She denies dysphagia, shortness of breath, nor voice change. she returns for f/u of her hypothyroidism. she has successfully quit smoking.  Review of Systems  Constitutional: + steady weight, no fatigue, no subjective hyperthermia/hypothermia Eyes: no blurry vision, no xerophthalmia ENT: no sore throat, no nodules palpated in throat, no dysphagia/odynophagia, no hoarseness Cardiovascular: no CP/SOB/palpitations/leg swelling Respiratory: no cough/SOB Gastrointestinal: no N/V/D/C Musculoskeletal: no muscle/joint aches Skin: no rashes Neurological: no tremors/numbness/tingling/dizziness Psychiatric: no depression/anxiety  Objective:    BP 135/87   Pulse 81   Ht 5' 1"  (1.549 m)   Wt 152 lb (68.9 kg)   BMI 28.72 kg/m   Wt Readings from Last 3 Encounters:  05/20/18 152 lb (68.9 kg)  11/19/17 154 lb (69.9 kg)  05/20/17 148 lb (67.1 kg)     Physical Exam  Constitutional: overweight, in NAD Eyes: PERRLA, EOMI, no exophthalmos ENT: moist mucous membranes, + thyroidectomy scar, + cervical lymphadenopathy on ultrasound. Musculoskeletal: no deformities, strength intact in all 4 Skin: moist, warm, no rashes Neurological: no tremor with outstretched hands   Results for orders placed or performed in visit on 11/19/17  T4, free  Result Value Ref Range   Free T4 1.6 0.8 - 1.8 ng/dL  TSH  Result Value Ref Range   TSH 0.71 mIU/L   Complete Blood Count (Most recent): Lab Results  Component Value Date   WBC 10.6 (H) 04/22/2012   HGB 15.3 (H) 04/22/2012   HCT 44.6 04/22/2012   MCV 90.1 04/22/2012   PLT 295 04/22/2012   Chemistry (most recent): Lab Results  Component Value Date   NA 138 04/22/2012   K 4.0 04/22/2012   CL 100 04/22/2012   CO2 26 04/22/2012   BUN 8 04/22/2012   CREATININE 0.51 04/22/2012    Thyroid ultrasound on Apr 30, 2018 IMPRESSION: 1. No definitive sonographic evidence of residual or locally recurrent disease. 2. Prominent though non pathologically enlarged and benign appearing bilateral cervical lymph nodes presumably reactive etiology   Assessment & Plan:   1. Hypothyroidism : Postsurgical  -Her previsit thyroid function tests are consistent with appropriate replacement.   I will  continue  Synthroid 125 g by mouth every morning.  Thyroid hormone intact  2. Malignant neoplasm of thyroid gland  Her last thyrogen stimulated whole-body scan FROM JUNE 2015 was negative for tumor recurrence. Stimulated TG level is 0.2 ( 2.8-40.9). Her last thyroid u/s from May 2014  was negative for any residual thyroid tissue. - She needed subsequent surveillance imaging, however this is delayed due to the fact that she does not have insurance.   Neck exam is unremarkable, however, her repeat neck/thyroid ultrasound, is significant for surgically absent thyroid, however benign-appearing cervical  lymphadenopathy.  She would not require intervention at this time.  She is advised to obtain another ultrasound in 1 year.    Follow up plan: Return in about 1 year (around 05/21/2019) for Thyroid / Neck Ultrasound.  Glade Lloyd, MD Phone: [phone]  Fax: [phone]  -  This note was partially dictated with voice recognition software. Similar sounding words can be transcribed inadequately or may not  be corrected upon review.  05/20/2018, 11:31 AM

## 2018-07-31 DIAGNOSIS — R739 Hyperglycemia, unspecified: Secondary | ICD-10-CM | POA: Diagnosis not present

## 2018-07-31 DIAGNOSIS — I10 Essential (primary) hypertension: Secondary | ICD-10-CM | POA: Diagnosis not present

## 2018-07-31 DIAGNOSIS — Z85850 Personal history of malignant neoplasm of thyroid: Secondary | ICD-10-CM | POA: Diagnosis not present

## 2018-07-31 DIAGNOSIS — F419 Anxiety disorder, unspecified: Secondary | ICD-10-CM | POA: Diagnosis not present

## 2018-08-04 DIAGNOSIS — F419 Anxiety disorder, unspecified: Secondary | ICD-10-CM | POA: Diagnosis not present

## 2018-08-04 DIAGNOSIS — I10 Essential (primary) hypertension: Secondary | ICD-10-CM | POA: Diagnosis not present

## 2018-08-04 DIAGNOSIS — Z85850 Personal history of malignant neoplasm of thyroid: Secondary | ICD-10-CM | POA: Diagnosis not present

## 2018-08-04 DIAGNOSIS — R739 Hyperglycemia, unspecified: Secondary | ICD-10-CM | POA: Diagnosis not present

## 2019-02-02 DIAGNOSIS — E785 Hyperlipidemia, unspecified: Secondary | ICD-10-CM | POA: Diagnosis not present

## 2019-02-02 DIAGNOSIS — E039 Hypothyroidism, unspecified: Secondary | ICD-10-CM | POA: Diagnosis not present

## 2019-02-02 DIAGNOSIS — I10 Essential (primary) hypertension: Secondary | ICD-10-CM | POA: Diagnosis not present

## 2019-02-02 DIAGNOSIS — M542 Cervicalgia: Secondary | ICD-10-CM | POA: Diagnosis not present

## 2019-05-06 ENCOUNTER — Other Ambulatory Visit: Payer: Self-pay | Admitting: "Endocrinology

## 2019-05-06 DIAGNOSIS — E890 Postprocedural hypothyroidism: Secondary | ICD-10-CM

## 2019-05-06 DIAGNOSIS — C73 Malignant neoplasm of thyroid gland: Secondary | ICD-10-CM

## 2019-05-06 LAB — T4, FREE: Free T4: 1.7 ng/dL (ref 0.8–1.8)

## 2019-05-06 LAB — TSH: TSH: 1.21 mIU/L

## 2019-05-19 ENCOUNTER — Encounter: Payer: Self-pay | Admitting: "Endocrinology

## 2019-05-19 ENCOUNTER — Ambulatory Visit (INDEPENDENT_AMBULATORY_CARE_PROVIDER_SITE_OTHER): Payer: Medicare Other | Admitting: "Endocrinology

## 2019-05-19 ENCOUNTER — Other Ambulatory Visit: Payer: Self-pay

## 2019-05-19 DIAGNOSIS — E890 Postprocedural hypothyroidism: Secondary | ICD-10-CM

## 2019-05-19 MED ORDER — LEVOTHYROXINE SODIUM 125 MCG PO TABS: 125.0000 ug | ORAL_TABLET | Freq: Every day | ORAL | 4 refills | Status: DC

## 2019-05-19 NOTE — Progress Notes (Signed)
05/19/2019                                 Endocrinology Telehealth Visit Follow up Note -During COVID -19 Pandemic  I connected with Dana White on 05/19/2019   by telephone and verified that I am speaking with the correct person using two identifiers. Dana White, Sep 20, 1966. she has verbally consented to this visit. All issues noted in this document were discussed and addressed. The format was not optimal for physical exam.    Subjective:    Patient ID: Dana White, female    DOB: 1966/02/27,    Past Medical History:  Diagnosis Date  . Cancer (HCC)    papillary  . Hypertension   . Thyroid disease    Past Surgical History:  Procedure Laterality Date  . APPENDECTOMY    . THYROIDECTOMY     Social History   Socioeconomic History  . Marital status: Married    Spouse name: Not on file  . Number of children: Not on file  . Years of education: Not on file  . Highest education level: Not on file  Occupational History  . Not on file  Social Needs  . Financial resource strain: Not on file  . Food insecurity:    Worry: Not on file    Inability: Not on file  . Transportation needs:    Medical: Not on file    Non-medical: Not on file  Tobacco Use  . Smoking status: Current Every Day Smoker  . Smokeless tobacco: Never Used  Substance and Sexual Activity  . Alcohol use: No  . Drug use: No  . Sexual activity: Not on file  Lifestyle  . Physical activity:    Days per week: Not on file    Minutes per session: Not on file  . Stress: Not on file  Relationships  . Social connections:    Talks on phone: Not on file    Gets together: Not on file    Attends religious service: Not on file    Active member of club or organization: Not on file    Attends meetings of clubs or organizations: Not on file    Relationship status: Not on file  Other Topics Concern  . Not on file  Social History Narrative  . Not on file   Outpatient Encounter Medications as of  05/19/2019  Medication Sig  . acetaminophen (TYLENOL) 500 MG tablet Take 500 mg by mouth every 6 (six) hours as needed.  . ALPRAZolam (XANAX) 0.25 MG tablet Take 0.25 mg by mouth 2 (two) times daily as needed for anxiety.  . cyclobenzaprine (FLEXERIL) 10 MG tablet Take 10 mg by mouth at bedtime.  Marland Kitchen HYDROcodone-acetaminophen (NORCO/VICODIN) 5-325 MG tablet Take 1 tablet by mouth as needed for moderate pain.  Marland Kitchen levothyroxine (SYNTHROID) 125 MCG tablet Take 1 tablet (125 mcg total) by mouth daily before breakfast.  . lisinopril (PRINIVIL,ZESTRIL) 5 MG tablet Take 5 mg by mouth daily.  . [DISCONTINUED] levothyroxine (SYNTHROID, LEVOTHROID) 125 MCG tablet Take 1 tablet (125 mcg total) by mouth daily before breakfast.   No facility-administered encounter medications on file as of 05/19/2019.    ALLERGIES: No Known Allergies VACCINATION STATUS:  There is no immunization history on file for this patient.  HPI  53 yo female with medical h/o PTC s/p near total thyroidectomy in June 2009. and I-131 therapy 18mi on 07/20/2008. She is  currently on Synthroid 125 mcg p.o. every morning.  She reports compliance, no new complaints today.    Her recent ( June 2015) as well as prior rTSH WBS was negative for any evidence of  thyroid cancer in October 2012 and her most recent thyroid u/s in May 2014 is negative for any residual thyroid tissue.  -She has had  surveillance thyroid/neck ultrasound on Apr 30, 2018 which showed surgically absent thyroid, benign-appearing cervical lymphadenopathy.    no interval issues. She denies dysphagia, shortness of breath, nor voice change. she returns for f/u of her hypothyroidism. she has successfully quit smoking.  Review of Systems  Constitutional: + steady weight, no fatigue, no subjective hyperthermia/hypothermia Eyes: no blurry vision, no xerophthalmia ENT: no sore throat, no nodules palpated in throat, no dysphagia/odynophagia, no hoarseness Cardiovascular: no  CP/SOB/palpitations/leg swelling Respiratory: no cough/SOB Gastrointestinal: no N/V/D/C Musculoskeletal: no muscle/joint aches Skin: no rashes Neurological: no tremors/numbness/tingling/dizziness Psychiatric: no depression/anxiety  Objective:    There were no vitals taken for this visit.  Wt Readings from Last 3 Encounters:  05/20/18 152 lb (68.9 kg)  11/19/17 154 lb (69.9 kg)  05/20/17 148 lb (67.1 kg)    Physical Exam   Results for orders placed or performed in visit on 05/06/19  T4, Free  Result Value Ref Range   Free T4 1.7 0.8 - 1.8 ng/dL  TSH  Result Value Ref Range   TSH 1.21 mIU/L   Complete Blood Count (Most recent): Lab Results  Component Value Date   WBC 10.6 (H) 04/22/2012   HGB 15.3 (H) 04/22/2012   HCT 44.6 04/22/2012   MCV 90.1 04/22/2012   PLT 295 04/22/2012   Chemistry (most recent): Lab Results  Component Value Date   NA 138 04/22/2012   K 4.0 04/22/2012   CL 100 04/22/2012   CO2 26 04/22/2012   BUN 8 04/22/2012   CREATININE 0.51 04/22/2012    Thyroid ultrasound on Apr 30, 2018 IMPRESSION: 1. No definitive sonographic evidence of residual or locally recurrent disease. 2. Prominent though non pathologically enlarged and benign appearing bilateral cervical lymph nodes presumably reactive etiology   Assessment & Plan:   1. Hypothyroidism : Postsurgical  -Her previsit thyroid function tests are consistent with appropriate replacement.   -She is advised to continue  Synthroid 125 g by mouth every morning.    - We discussed about the correct intake of her thyroid hormone, on empty stomach at fasting, with water, separated by at least 30 minutes from breakfast and other medications,  and separated by more than 4 hours from calcium, iron, multivitamins, acid reflux medications (PPIs). -Patient is made aware of the fact that thyroid hormone replacement is needed for life, dose to be adjusted by periodic monitoring of thyroid function  tests.   2. Malignant neoplasm of thyroid gland  Her last thyrogen stimulated whole-body scan FROM JUNE 2015 was negative for tumor recurrence. Stimulated TG level is 0.2 ( 2.8-40.9). Her last thyroid u/s from May 2014  was negative for any residual thyroid tissue. - She needed subsequent surveillance imaging, however this is delayed due to the fact that she does not have insurance.   Neck exam is unremarkable, however, her repeat neck/thyroid ultrasound, is significant for surgically absent thyroid, however benign-appearing cervical lymphadenopathy.  She would not require intervention at this time.  She is advised to obtain another ultrasound in 1 year.    Follow up plan: Return in about 1 year (around 05/18/2020) for Follow up with Pre-visit  Labs.  Glade Lloyd, MD Phone: [phone]  Fax: [phone]  -  This note was partially dictated with voice recognition software. Similar sounding words can be transcribed inadequately or may not  be corrected upon review.  05/19/2019, 6:05 PM

## 2019-05-20 ENCOUNTER — Ambulatory Visit: Payer: Medicare Other | Admitting: "Endocrinology

## 2019-08-03 DIAGNOSIS — I10 Essential (primary) hypertension: Secondary | ICD-10-CM | POA: Diagnosis not present

## 2019-08-03 DIAGNOSIS — F419 Anxiety disorder, unspecified: Secondary | ICD-10-CM | POA: Diagnosis not present

## 2019-08-03 DIAGNOSIS — E785 Hyperlipidemia, unspecified: Secondary | ICD-10-CM | POA: Diagnosis not present

## 2019-08-03 DIAGNOSIS — K210 Gastro-esophageal reflux disease with esophagitis: Secondary | ICD-10-CM | POA: Diagnosis not present

## 2019-11-02 DIAGNOSIS — F419 Anxiety disorder, unspecified: Secondary | ICD-10-CM | POA: Diagnosis not present

## 2019-11-02 DIAGNOSIS — I10 Essential (primary) hypertension: Secondary | ICD-10-CM | POA: Diagnosis not present

## 2019-11-02 DIAGNOSIS — Z23 Encounter for immunization: Secondary | ICD-10-CM | POA: Diagnosis not present

## 2019-11-02 DIAGNOSIS — Z85850 Personal history of malignant neoplasm of thyroid: Secondary | ICD-10-CM | POA: Diagnosis not present

## 2019-11-02 DIAGNOSIS — K2100 Gastro-esophageal reflux disease with esophagitis, without bleeding: Secondary | ICD-10-CM | POA: Diagnosis not present

## 2019-12-27 DIAGNOSIS — E785 Hyperlipidemia, unspecified: Secondary | ICD-10-CM | POA: Diagnosis not present

## 2019-12-27 DIAGNOSIS — F633 Trichotillomania: Secondary | ICD-10-CM | POA: Diagnosis not present

## 2019-12-27 DIAGNOSIS — F419 Anxiety disorder, unspecified: Secondary | ICD-10-CM | POA: Diagnosis not present

## 2019-12-27 DIAGNOSIS — E039 Hypothyroidism, unspecified: Secondary | ICD-10-CM | POA: Diagnosis not present

## 2019-12-27 DIAGNOSIS — I10 Essential (primary) hypertension: Secondary | ICD-10-CM | POA: Diagnosis not present

## 2019-12-27 DIAGNOSIS — Z0189 Encounter for other specified special examinations: Secondary | ICD-10-CM | POA: Diagnosis not present

## 2019-12-27 DIAGNOSIS — Z85850 Personal history of malignant neoplasm of thyroid: Secondary | ICD-10-CM | POA: Diagnosis not present

## 2019-12-27 DIAGNOSIS — M542 Cervicalgia: Secondary | ICD-10-CM | POA: Diagnosis not present

## 2020-01-12 DIAGNOSIS — I10 Essential (primary) hypertension: Secondary | ICD-10-CM | POA: Diagnosis not present

## 2020-01-12 DIAGNOSIS — Z1329 Encounter for screening for other suspected endocrine disorder: Secondary | ICD-10-CM | POA: Diagnosis not present

## 2020-01-12 DIAGNOSIS — E039 Hypothyroidism, unspecified: Secondary | ICD-10-CM | POA: Diagnosis not present

## 2020-01-12 DIAGNOSIS — E785 Hyperlipidemia, unspecified: Secondary | ICD-10-CM | POA: Diagnosis not present

## 2020-01-19 DIAGNOSIS — E785 Hyperlipidemia, unspecified: Secondary | ICD-10-CM | POA: Diagnosis not present

## 2020-01-19 DIAGNOSIS — Z0001 Encounter for general adult medical examination with abnormal findings: Secondary | ICD-10-CM | POA: Diagnosis not present

## 2020-01-19 DIAGNOSIS — E039 Hypothyroidism, unspecified: Secondary | ICD-10-CM | POA: Diagnosis not present

## 2020-01-19 DIAGNOSIS — Z85850 Personal history of malignant neoplasm of thyroid: Secondary | ICD-10-CM | POA: Diagnosis not present

## 2020-01-19 DIAGNOSIS — F633 Trichotillomania: Secondary | ICD-10-CM | POA: Diagnosis not present

## 2020-01-19 DIAGNOSIS — Z6829 Body mass index (BMI) 29.0-29.9, adult: Secondary | ICD-10-CM | POA: Diagnosis not present

## 2020-01-19 DIAGNOSIS — E663 Overweight: Secondary | ICD-10-CM | POA: Diagnosis not present

## 2020-01-19 DIAGNOSIS — I10 Essential (primary) hypertension: Secondary | ICD-10-CM | POA: Diagnosis not present

## 2020-01-19 DIAGNOSIS — Z124 Encounter for screening for malignant neoplasm of cervix: Secondary | ICD-10-CM | POA: Diagnosis not present

## 2020-01-19 DIAGNOSIS — E782 Mixed hyperlipidemia: Secondary | ICD-10-CM | POA: Diagnosis not present

## 2020-01-19 DIAGNOSIS — F419 Anxiety disorder, unspecified: Secondary | ICD-10-CM | POA: Diagnosis not present

## 2020-01-19 DIAGNOSIS — D72828 Other elevated white blood cell count: Secondary | ICD-10-CM | POA: Diagnosis not present

## 2020-01-19 DIAGNOSIS — M542 Cervicalgia: Secondary | ICD-10-CM | POA: Diagnosis not present

## 2020-01-19 DIAGNOSIS — Z0189 Encounter for other specified special examinations: Secondary | ICD-10-CM | POA: Diagnosis not present

## 2020-01-20 ENCOUNTER — Other Ambulatory Visit (HOSPITAL_COMMUNITY): Payer: Self-pay | Admitting: Internal Medicine

## 2020-01-20 DIAGNOSIS — Z1231 Encounter for screening mammogram for malignant neoplasm of breast: Secondary | ICD-10-CM

## 2020-01-26 ENCOUNTER — Encounter (HOSPITAL_COMMUNITY): Payer: Self-pay

## 2020-01-26 ENCOUNTER — Other Ambulatory Visit: Payer: Self-pay

## 2020-01-26 ENCOUNTER — Ambulatory Visit (HOSPITAL_COMMUNITY)
Admission: RE | Admit: 2020-01-26 | Discharge: 2020-01-26 | Disposition: A | Discharge status: Home / Self Care | Payer: Medicare Other | Source: Ambulatory Visit | Attending: Internal Medicine | Admitting: Internal Medicine

## 2020-01-26 DIAGNOSIS — Z1231 Encounter for screening mammogram for malignant neoplasm of breast: Secondary | ICD-10-CM

## 2020-01-31 ENCOUNTER — Other Ambulatory Visit (HOSPITAL_COMMUNITY): Payer: Self-pay | Admitting: Internal Medicine

## 2020-01-31 DIAGNOSIS — R928 Other abnormal and inconclusive findings on diagnostic imaging of breast: Secondary | ICD-10-CM

## 2020-01-31 DIAGNOSIS — R590 Localized enlarged lymph nodes: Secondary | ICD-10-CM

## 2020-02-08 ENCOUNTER — Ambulatory Visit (HOSPITAL_COMMUNITY)
Admission: RE | Admit: 2020-02-08 | Discharge: 2020-02-08 | Disposition: A | Discharge status: Home / Self Care | Payer: Medicare Other | Source: Ambulatory Visit | Attending: Internal Medicine | Admitting: Internal Medicine

## 2020-02-08 ENCOUNTER — Other Ambulatory Visit: Payer: Self-pay

## 2020-02-08 DIAGNOSIS — R928 Other abnormal and inconclusive findings on diagnostic imaging of breast: Secondary | ICD-10-CM | POA: Diagnosis not present

## 2020-02-08 DIAGNOSIS — R590 Localized enlarged lymph nodes: Secondary | ICD-10-CM | POA: Diagnosis not present

## 2020-02-08 DIAGNOSIS — N6489 Other specified disorders of breast: Secondary | ICD-10-CM | POA: Diagnosis not present

## 2020-02-08 DIAGNOSIS — N6310 Unspecified lump in the right breast, unspecified quadrant: Secondary | ICD-10-CM | POA: Diagnosis not present

## 2020-02-22 DIAGNOSIS — Z1211 Encounter for screening for malignant neoplasm of colon: Secondary | ICD-10-CM | POA: Diagnosis not present

## 2020-02-25 LAB — EXTERNAL GENERIC LAB PROCEDURE: COLOGUARD: NEGATIVE

## 2020-02-25 LAB — COLOGUARD: COLOGUARD: NEGATIVE

## 2020-04-27 ENCOUNTER — Other Ambulatory Visit: Payer: Self-pay

## 2020-04-27 ENCOUNTER — Telehealth: Payer: Self-pay | Admitting: "Endocrinology

## 2020-04-27 DIAGNOSIS — E890 Postprocedural hypothyroidism: Secondary | ICD-10-CM

## 2020-04-27 NOTE — Telephone Encounter (Signed)
Lab orders updated

## 2020-04-27 NOTE — Telephone Encounter (Signed)
Can you update lab order °

## 2020-05-08 DIAGNOSIS — E890 Postprocedural hypothyroidism: Secondary | ICD-10-CM | POA: Diagnosis not present

## 2020-05-08 LAB — TSH: TSH: 1.22 mIU/L

## 2020-05-08 LAB — T4, FREE: Free T4: 1.6 ng/dL (ref 0.8–1.8)

## 2020-05-18 ENCOUNTER — Ambulatory Visit (INDEPENDENT_AMBULATORY_CARE_PROVIDER_SITE_OTHER): Payer: Medicare Other | Admitting: "Endocrinology

## 2020-05-18 ENCOUNTER — Encounter: Payer: Self-pay | Admitting: "Endocrinology

## 2020-05-18 ENCOUNTER — Other Ambulatory Visit: Payer: Self-pay

## 2020-05-18 VITALS — BP 118/75 | HR 67 | Ht 60.0 in | Wt 158.0 lb

## 2020-05-18 DIAGNOSIS — E890 Postprocedural hypothyroidism: Secondary | ICD-10-CM

## 2020-05-18 DIAGNOSIS — Z85850 Personal history of malignant neoplasm of thyroid: Secondary | ICD-10-CM | POA: Diagnosis not present

## 2020-05-18 MED ORDER — LEVOTHYROXINE SODIUM 125 MCG PO TABS: 125.0000 ug | ORAL_TABLET | Freq: Every day | ORAL | 4 refills | Status: DC

## 2020-05-18 NOTE — Progress Notes (Signed)
05/18/2020                Endocrinology follow-up note  Subjective:    Patient ID: Dana White, female    DOB: [birth year],    Past Medical History:  Diagnosis Date  . Cancer (HCC)    papillary  . Hypertension   . Thyroid disease    Past Surgical History:  Procedure Laterality Date  . APPENDECTOMY    . THYROIDECTOMY     Social History   Socioeconomic History  . Marital status: Married    Spouse name: Not on file  . Number of children: Not on file  . Years of education: Not on file  . Highest education level: Not on file  Occupational History  . Not on file  Tobacco Use  . Smoking status: Former Research scientist (life sciences)  . Smokeless tobacco: Never Used  Substance and Sexual Activity  . Alcohol use: No  . Drug use: No  . Sexual activity: Not on file  Other Topics Concern  . Not on file  Social History Narrative  . Not on file   Social Determinants of Health   Financial Resource Strain:   . Difficulty of Paying Living Expenses:   Food Insecurity:   . Worried About Charity fundraiser in the Last Year:   . Arboriculturist in the Last Year:   Transportation Needs:   . Film/video editor (Medical):   Marland Kitchen Lack of Transportation (Non-Medical):   Physical Activity:   . Days of Exercise per Week:   . Minutes of Exercise per Session:   Stress:   . Feeling of Stress :   Social Connections:   . Frequency of Communication with Friends and Family:   . Frequency of Social Gatherings with Friends and Family:   . Attends Religious Services:   . Active Member of Clubs or Organizations:   . Attends Archivist Meetings:   Marland Kitchen Marital Status:    Outpatient Encounter Medications as of 05/18/2020  Medication Sig  . sertraline (ZOLOFT) 25 MG tablet Take 25 mg by mouth daily.  Marland Kitchen acetaminophen (TYLENOL) 500 MG tablet Take 500 mg by mouth every 6 (six) hours as needed.  . ALPRAZolam (XANAX) 0.25 MG tablet Take 0.25 mg by mouth 2 (two) times daily as needed for anxiety.  .  cyclobenzaprine (FLEXERIL) 10 MG tablet Take 10 mg by mouth at bedtime.  Marland Kitchen levothyroxine (SYNTHROID) 125 MCG tablet Take 1 tablet (125 mcg total) by mouth daily before breakfast.  . lisinopril (PRINIVIL,ZESTRIL) 5 MG tablet Take 5 mg by mouth daily.  . [DISCONTINUED] HYDROcodone-acetaminophen (NORCO/VICODIN) 5-325 MG tablet Take 1 tablet by mouth as needed for moderate pain.  . [DISCONTINUED] levothyroxine (SYNTHROID) 125 MCG tablet Take 1 tablet (125 mcg total) by mouth daily before breakfast.   No facility-administered encounter medications on file as of 05/18/2020.   ALLERGIES: No Known Allergies VACCINATION STATUS:  There is no immunization history on file for this patient.  HPI  54 yo female with medical h/o PTC s/p near total thyroidectomy in June 2009. and I-131 therapy 153mi on 07/20/2008. She is currently on Synthroid 125 mcg p.o. every morning.  She reports compliance, no new complaints today.    Her last ( June 2015) as well as prior rTSH WBS was negative for any evidence of  thyroid cancer in October 2012 and her most recent thyroid u/s in May 2014 is negative for any residual thyroid tissue.  -She has had  surveillance thyroid/neck ultrasound on Apr 30, 2018 which showed surgically absent thyroid, benign-appearing cervical lymphadenopathy.    no interval issues. She denies dysphagia, shortness of breath, nor voice change. she returns for f/u of her hypothyroidism. she has successfully quit smoking. -She has no new complaints today.  Review of Systems  Constitutional: + Gained 6 pounds since last visit ,  no fatigue, no subjective hyperthermia/hypothermia Eyes: no blurry vision, no xerophthalmia ENT: no sore throat, no nodules palpated in throat, no dysphagia/odynophagia, no hoarseness Cardiovascular: no CP/SOB/palpitations/leg swelling Respiratory: no cough/SOB Gastrointestinal: no N/V/D/C Musculoskeletal: no muscle/joint aches Skin: no rashes Neurological: no  tremors/numbness/tingling/dizziness Psychiatric: no depression/anxiety  Objective:    BP 118/75   Pulse 67   Ht 5' (1.524 m)   Wt 158 lb (71.7 kg)   BMI 30.86 kg/m   Wt Readings from Last 3 Encounters:  05/18/20 158 lb (71.7 kg)  05/20/18 152 lb (68.9 kg)  11/19/17 154 lb (69.9 kg)    Physical Exam   Physical Exam- Limited  Constitutional:  Body mass index is 30.86 kg/m. , not in acute distress, normal state of mind Eyes:  EOMI, no exophthalmos Neck: Supple Thyroid: + Scar from her prior surgery on anterior lower neck, no gross goiter Respiratory: Adequate breathing efforts Musculoskeletal: no gross deformities, strength intact in all four extremities, no gross restriction of joint movements Skin:  no rashes, no hyperemia Neurological: no tremor with outstretched hands,   Results for orders placed or performed in visit on 04/27/20  T4, Free  Result Value Ref Range   Free T4 1.6 0.8 - 1.8 ng/dL  TSH  Result Value Ref Range   TSH 1.22 mIU/L   Complete Blood Count (Most recent): Lab Results  Component Value Date   WBC 10.6 (H) 04/22/2012   HGB 15.3 (H) 04/22/2012   HCT 44.6 04/22/2012   MCV 90.1 04/22/2012   PLT 295 04/22/2012   Chemistry (most recent): Lab Results  Component Value Date   NA 138 04/22/2012   K 4.0 04/22/2012   CL 100 04/22/2012   CO2 26 04/22/2012   BUN 8 04/22/2012   CREATININE 0.51 04/22/2012    Thyroid ultrasound on Apr 30, 2018 IMPRESSION: 1. No definitive sonographic evidence of residual or locally recurrent disease. 2. Prominent though non pathologically enlarged and benign appearing bilateral cervical lymph nodes presumably reactive etiology   Assessment & Plan:   1. Hypothyroidism : Postsurgical  -Her previsit thyroid function tests are consistent with appropriate replacement.     -She is advised to continue  Synthroid 125 g by mouth every morning.    - We discussed about the correct intake of her thyroid hormone, on  empty stomach at fasting, with water, separated by at least 30 minutes from breakfast and other medications,  and separated by more than 4 hours from calcium, iron, multivitamins, acid reflux medications (PPIs). -Patient is made aware of the fact that thyroid hormone replacement is needed for life, dose to be adjusted by periodic monitoring of thyroid function tests.   2. Malignant neoplasm of thyroid gland  Her last thyrogen stimulated whole-body scan FROM JUNE 2015 was negative for tumor recurrence. Stimulated TG level is 0.2 ( 2.8-40.9). Her last thyroid u/s from May 2014  was negative for any residual thyroid tissue. - She needed subsequent surveillance imaging, however this is delayed due to the fact that she does not have insurance.   Neck exam is unremarkable, however, her repeat neck/thyroid ultrasound, is significant for surgically  absent thyroid, however benign-appearing cervical lymphadenopathy.   She offered surveillance thyroid ultrasound before her next visit in 6 months     - Time spent on this patient care encounter:  20 minutes of which 50% was spent in  counseling and the rest reviewing  her current and  previous labs / studies and medications  doses and developing a plan for long term care. Grayson D Tool  participated in the discussions, expressed understanding, and voiced agreement with the above plans.  All questions were answered to her satisfaction. she is encouraged to contact clinic should she have any questions or concerns prior to her return visit.   Follow up plan: Return in about 6 months (around 11/17/2020) for Thyroid / Neck Ultrasound, F/U with Pre-visit Labs.  Glade Lloyd, MD Phone: [phone]  Fax: [phone]  -  This note was partially dictated with voice recognition software. Similar sounding words can be transcribed inadequately or may not  be corrected upon review.  05/18/2020, 9:05 AM

## 2020-07-20 DIAGNOSIS — E039 Hypothyroidism, unspecified: Secondary | ICD-10-CM | POA: Diagnosis not present

## 2020-07-20 DIAGNOSIS — E785 Hyperlipidemia, unspecified: Secondary | ICD-10-CM | POA: Diagnosis not present

## 2020-07-20 DIAGNOSIS — E782 Mixed hyperlipidemia: Secondary | ICD-10-CM | POA: Diagnosis not present

## 2020-07-20 DIAGNOSIS — Z1329 Encounter for screening for other suspected endocrine disorder: Secondary | ICD-10-CM | POA: Diagnosis not present

## 2020-07-20 DIAGNOSIS — I10 Essential (primary) hypertension: Secondary | ICD-10-CM | POA: Diagnosis not present

## 2020-07-25 DIAGNOSIS — F419 Anxiety disorder, unspecified: Secondary | ICD-10-CM | POA: Diagnosis not present

## 2020-07-25 DIAGNOSIS — E785 Hyperlipidemia, unspecified: Secondary | ICD-10-CM | POA: Diagnosis not present

## 2020-07-25 DIAGNOSIS — E6609 Other obesity due to excess calories: Secondary | ICD-10-CM | POA: Diagnosis not present

## 2020-07-25 DIAGNOSIS — R7303 Prediabetes: Secondary | ICD-10-CM | POA: Diagnosis not present

## 2020-07-25 DIAGNOSIS — E039 Hypothyroidism, unspecified: Secondary | ICD-10-CM | POA: Diagnosis not present

## 2020-07-25 DIAGNOSIS — Z85850 Personal history of malignant neoplasm of thyroid: Secondary | ICD-10-CM | POA: Diagnosis not present

## 2020-07-25 DIAGNOSIS — D72828 Other elevated white blood cell count: Secondary | ICD-10-CM | POA: Diagnosis not present

## 2020-07-25 DIAGNOSIS — M542 Cervicalgia: Secondary | ICD-10-CM | POA: Diagnosis not present

## 2020-07-25 DIAGNOSIS — I10 Essential (primary) hypertension: Secondary | ICD-10-CM | POA: Diagnosis not present

## 2020-07-25 DIAGNOSIS — Z6831 Body mass index (BMI) 31.0-31.9, adult: Secondary | ICD-10-CM | POA: Diagnosis not present

## 2020-07-25 DIAGNOSIS — E782 Mixed hyperlipidemia: Secondary | ICD-10-CM | POA: Diagnosis not present

## 2020-07-25 DIAGNOSIS — F633 Trichotillomania: Secondary | ICD-10-CM | POA: Diagnosis not present

## 2020-09-05 DIAGNOSIS — F633 Trichotillomania: Secondary | ICD-10-CM | POA: Diagnosis not present

## 2020-09-05 DIAGNOSIS — F419 Anxiety disorder, unspecified: Secondary | ICD-10-CM | POA: Diagnosis not present

## 2020-09-05 DIAGNOSIS — M542 Cervicalgia: Secondary | ICD-10-CM | POA: Diagnosis not present

## 2020-10-16 ENCOUNTER — Ambulatory Visit (HOSPITAL_COMMUNITY)
Admission: RE | Admit: 2020-10-16 | Discharge: 2020-10-16 | Disposition: A | Discharge status: Home / Self Care | Payer: Medicare Other | Source: Ambulatory Visit | Attending: "Endocrinology | Admitting: "Endocrinology

## 2020-10-16 ENCOUNTER — Other Ambulatory Visit: Payer: Self-pay

## 2020-10-16 DIAGNOSIS — E890 Postprocedural hypothyroidism: Secondary | ICD-10-CM | POA: Diagnosis not present

## 2020-10-16 DIAGNOSIS — Z85850 Personal history of malignant neoplasm of thyroid: Secondary | ICD-10-CM | POA: Insufficient documentation

## 2020-10-16 DIAGNOSIS — R590 Localized enlarged lymph nodes: Secondary | ICD-10-CM | POA: Diagnosis not present

## 2020-11-14 DIAGNOSIS — E890 Postprocedural hypothyroidism: Secondary | ICD-10-CM | POA: Diagnosis not present

## 2020-11-15 LAB — TSH: TSH: 1.9 mIU/L (ref 0.40–4.50)

## 2020-11-15 LAB — T4, FREE: Free T4: 1.2 ng/dL (ref 0.8–1.8)

## 2020-11-15 LAB — THYROGLOBULIN LEVEL: Thyroglobulin: 0.1 ng/mL — ABNORMAL LOW

## 2020-11-21 ENCOUNTER — Encounter: Payer: Self-pay | Admitting: "Endocrinology

## 2020-11-21 ENCOUNTER — Telehealth (INDEPENDENT_AMBULATORY_CARE_PROVIDER_SITE_OTHER): Payer: Medicare Other | Admitting: "Endocrinology

## 2020-11-21 VITALS — BP 120/70 | HR 72 | Ht 60.0 in | Wt 160.0 lb

## 2020-11-21 DIAGNOSIS — E890 Postprocedural hypothyroidism: Secondary | ICD-10-CM

## 2020-11-21 NOTE — Progress Notes (Signed)
11/21/2020                                               Endocrinology Telehealth Visit Follow up Note -During COVID -19 Pandemic  This visit type was conducted  via telephone due to national recommendations for restrictions regarding the COVID-19 Pandemic  in an effort to limit this patient's exposure and mitigate transmission of the corona virus.   I connected with Dana White on 11/21/2020   by telephone and verified that I am speaking with the correct person using two identifiers. Dana White, 04-06-1966. she has verbally consented to this visit.  I was in my office and patient was in her residence. No other persons were with me during the encounter. All issues noted in this document were discussed and addressed. The format was not optimal for physical exam.    Subjective:    Patient ID: Dana White, female    DOB: [birth year],    Past Medical History:  Diagnosis Date  . Cancer (HCC)    papillary  . Hypertension   . Thyroid disease    Past Surgical History:  Procedure Laterality Date  . APPENDECTOMY    . THYROIDECTOMY     Social History   Socioeconomic History  . Marital status: Married    Spouse name: Not on file  . Number of children: Not on file  . Years of education: Not on file  . Highest education level: Not on file  Occupational History  . Not on file  Tobacco Use  . Smoking status: Former Research scientist (life sciences)  . Smokeless tobacco: Never Used  Vaping Use  . Vaping Use: Never used  Substance and Sexual Activity  . Alcohol use: No  . Drug use: No  . Sexual activity: Not on file  Other Topics Concern  . Not on file  Social History Narrative  . Not on file   Social Determinants of Health   Financial Resource Strain:   . Difficulty of Paying Living Expenses: Not on file  Food Insecurity:   . Worried About Charity fundraiser in the Last Year: Not on file  . Ran Out of Food in the Last Year: Not on file  Transportation Needs:   . Lack of  Transportation (Medical): Not on file  . Lack of Transportation (Non-Medical): Not on file  Physical Activity:   . Days of Exercise per Week: Not on file  . Minutes of Exercise per Session: Not on file  Stress:   . Feeling of Stress : Not on file  Social Connections:   . Frequency of Communication with Friends and Family: Not on file  . Frequency of Social Gatherings with Friends and Family: Not on file  . Attends Religious Services: Not on file  . Active Member of Clubs or Organizations: Not on file  . Attends Archivist Meetings: Not on file  . Marital Status: Not on file   Outpatient Encounter Medications as of 11/21/2020  Medication Sig  . ALPRAZolam (XANAX) 0.5 MG tablet Take 0.5 mg by mouth 2 (two) times daily as needed.  . cyclobenzaprine (FLEXERIL) 10 MG tablet Take 10 mg by mouth at bedtime.  Marland Kitchen levothyroxine (SYNTHROID) 125 MCG tablet Take 1 tablet (125 mcg total) by mouth daily before breakfast.  . lisinopril (PRINIVIL,ZESTRIL) 5 MG tablet Take 10 mg by mouth  daily.   . sertraline (ZOLOFT) 25 MG tablet Take 50 mg by mouth daily.   . [DISCONTINUED] acetaminophen (TYLENOL) 500 MG tablet Take 500 mg by mouth every 6 (six) hours as needed.  . [DISCONTINUED] ALPRAZolam (XANAX) 0.25 MG tablet Take 0.25 mg by mouth 2 (two) times daily as needed for anxiety.   No facility-administered encounter medications on file as of 11/21/2020.   ALLERGIES: No Known Allergies VACCINATION STATUS:  There is no immunization history on file for this patient.  HPI  54 yo female with medical h/o PTC s/p near total thyroidectomy in June 2009. and I-131 therapy 177mi on 07/20/2008. She is currently on Synthroid 125 mcg p.o. every morning.  She reports compliance, no new complaints today.    Her last ( June 2015) as well as prior rTSH WBS was negative for any evidence of  thyroid cancer in October 2012 and her most recent thyroid u/s in May 2014 is negative for any residual thyroid tissue.   -She has had  surveillance thyroid/neck ultrasound on Apr 30, 2018 which showed surgically absent thyroid, benign-appearing cervical lymphadenopathy.   -Her most recent thyroid ultrasound on 10/16/2020 also showed surgically absent thyroid, unchanged benign-appearing cervical lymphadenopathy.  no interval issues. She denies dysphagia, shortness of breath, nor voice change. she returns for f/u of her hypothyroidism. she has successfully quit smoking. -She has no new complaints today.  Review of Systems Limited as above.  Objective:    BP 120/70   Pulse 72   Ht 5' (1.524 m)   Wt 160 lb (72.6 kg)   BMI 31.25 kg/m   Wt Readings from Last 3 Encounters:  11/21/20 160 lb (72.6 kg)  05/18/20 158 lb (71.7 kg)  05/20/18 152 lb (68.9 kg)    Physical Exam   Results for orders placed or performed in visit on 05/18/20  TSH  Result Value Ref Range   TSH 1.90 0.40 - 4.50 mIU/L  T4, free  Result Value Ref Range   Free T4 1.2 0.8 - 1.8 ng/dL  Thyroglobulin Level  Result Value Ref Range   Thyroglobulin 0.1 (L) ng/mL   Comment     Complete Blood Count (Most recent): Lab Results  Component Value Date   WBC 10.6 (H) 04/22/2012   HGB 15.3 (H) 04/22/2012   HCT 44.6 04/22/2012   MCV 90.1 04/22/2012   PLT 295 04/22/2012   Chemistry (most recent): Lab Results  Component Value Date   NA 138 04/22/2012   K 4.0 04/22/2012   CL 100 04/22/2012   CO2 26 04/22/2012   BUN 8 04/22/2012   CREATININE 0.51 04/22/2012    Thyroid ultrasound on Apr 30, 2018 IMPRESSION: 1. No definitive sonographic evidence of residual or locally recurrent disease. 2. Prominent though non pathologically enlarged and benign appearing bilateral cervical lymph nodes presumably reactive etiology   Thyroid ultrasound on 10/16/2020 IMPRESSION: 1. No evidence of residual or recurrent thyroid tissue or nodularity. 2. Surgical changes of prior total thyroidectomy. 3. Prominence but not technically enlarged (by  imaging criteria) submandibular station lymph nodes.   Assessment & Plan:   1. Hypothyroidism : Postsurgical  -Her previsit thyroid function tests are consistent with appropriate suppressive treatment.  She is advised to continue Synthroid 125 mcg p.o. daily before breakfast.     - We discussed about the correct intake of her thyroid hormone, on empty stomach at fasting, with water, separated by at least 30 minutes from breakfast and other medications,  and separated by more  than 4 hours from calcium, iron, multivitamins, acid reflux medications (PPIs). -Patient is made aware of the fact that thyroid hormone replacement is needed for life, dose to be adjusted by periodic monitoring of thyroid function tests.    2. Malignant neoplasm of thyroid gland  Her last thyrogen stimulated whole-body scan FROM JUNE 2015 was negative for tumor recurrence. Stimulated TG level is 0.2 ( 2.8-40.9). Her last thyroid u/s from May 2014  was negative for any residual thyroid tissue. - She needed subsequent surveillance imaging, however this is delayed due to the fact that she does not have insurance.   Neck exam is unremarkable, however, her repeat neck/thyroid ultrasound, is significant for surgically absent thyroid, however benign-appearing cervical lymphadenopathy.  This study was repeated on November 2021 showing same findings.  She does not need any further intervention for history of thyroid malignancy.  She is advised to maintain close follow-up with her PCP Dr. Nevada Crane.     - Time spent on this patient care encounter:  20 minutes of which 50% was spent in  counseling and the rest reviewing  her current and  previous labs / studies and medications  doses and developing a plan for long term care. Dana White  participated in the discussions, expressed understanding, and voiced agreement with the above plans.  All questions were answered to her satisfaction. she is encouraged to contact clinic should  she have any questions or concerns prior to her return visit.   Follow up plan: Return in about 1 year (around 11/21/2021) for F/U with Pre-visit Labs.  Glade Lloyd, MD Phone: [phone]  Fax: [phone]  -  This note was partially dictated with voice recognition software. Similar sounding words can be transcribed inadequately or may not  be corrected upon review.  11/21/2020, 9:09 AM

## 2021-01-24 DIAGNOSIS — Z85850 Personal history of malignant neoplasm of thyroid: Secondary | ICD-10-CM | POA: Diagnosis not present

## 2021-01-24 DIAGNOSIS — F633 Trichotillomania: Secondary | ICD-10-CM | POA: Diagnosis not present

## 2021-01-24 DIAGNOSIS — Z6831 Body mass index (BMI) 31.0-31.9, adult: Secondary | ICD-10-CM | POA: Diagnosis not present

## 2021-01-24 DIAGNOSIS — F419 Anxiety disorder, unspecified: Secondary | ICD-10-CM | POA: Diagnosis not present

## 2021-01-24 DIAGNOSIS — E785 Hyperlipidemia, unspecified: Secondary | ICD-10-CM | POA: Diagnosis not present

## 2021-01-24 DIAGNOSIS — R7303 Prediabetes: Secondary | ICD-10-CM | POA: Diagnosis not present

## 2021-01-24 DIAGNOSIS — E6609 Other obesity due to excess calories: Secondary | ICD-10-CM | POA: Diagnosis not present

## 2021-01-24 DIAGNOSIS — Z0189 Encounter for other specified special examinations: Secondary | ICD-10-CM | POA: Diagnosis not present

## 2021-01-24 DIAGNOSIS — E039 Hypothyroidism, unspecified: Secondary | ICD-10-CM | POA: Diagnosis not present

## 2021-01-24 DIAGNOSIS — M542 Cervicalgia: Secondary | ICD-10-CM | POA: Diagnosis not present

## 2021-01-24 DIAGNOSIS — D72828 Other elevated white blood cell count: Secondary | ICD-10-CM | POA: Diagnosis not present

## 2021-01-24 DIAGNOSIS — I10 Essential (primary) hypertension: Secondary | ICD-10-CM | POA: Diagnosis not present

## 2021-01-29 DIAGNOSIS — F419 Anxiety disorder, unspecified: Secondary | ICD-10-CM | POA: Diagnosis not present

## 2021-01-29 DIAGNOSIS — D72828 Other elevated white blood cell count: Secondary | ICD-10-CM | POA: Diagnosis not present

## 2021-01-29 DIAGNOSIS — E6609 Other obesity due to excess calories: Secondary | ICD-10-CM | POA: Diagnosis not present

## 2021-01-29 DIAGNOSIS — M542 Cervicalgia: Secondary | ICD-10-CM | POA: Diagnosis not present

## 2021-01-29 DIAGNOSIS — E559 Vitamin D deficiency, unspecified: Secondary | ICD-10-CM | POA: Diagnosis not present

## 2021-01-29 DIAGNOSIS — I10 Essential (primary) hypertension: Secondary | ICD-10-CM | POA: Diagnosis not present

## 2021-01-29 DIAGNOSIS — E785 Hyperlipidemia, unspecified: Secondary | ICD-10-CM | POA: Diagnosis not present

## 2021-01-29 DIAGNOSIS — Z6831 Body mass index (BMI) 31.0-31.9, adult: Secondary | ICD-10-CM | POA: Diagnosis not present

## 2021-01-29 DIAGNOSIS — E782 Mixed hyperlipidemia: Secondary | ICD-10-CM | POA: Diagnosis not present

## 2021-01-29 DIAGNOSIS — R7303 Prediabetes: Secondary | ICD-10-CM | POA: Diagnosis not present

## 2021-01-29 DIAGNOSIS — F633 Trichotillomania: Secondary | ICD-10-CM | POA: Diagnosis not present

## 2021-01-29 DIAGNOSIS — E039 Hypothyroidism, unspecified: Secondary | ICD-10-CM | POA: Diagnosis not present

## 2021-02-03 IMAGING — US US THYROID
1 series · 14 of 25 positions shown · non-contrast
Comparison: None.

CLINICAL DATA: Other. Surgical changes of prior total thyroidectomy
in 7008.

EXAM:
THYROID ULTRASOUND
TECHNIQUE: Ultrasound examination of the thyroid gland and adjacent soft
tissues was performed.

[Series 1: us thyroid · 14 of 42 slices shown]
[im 1/42]
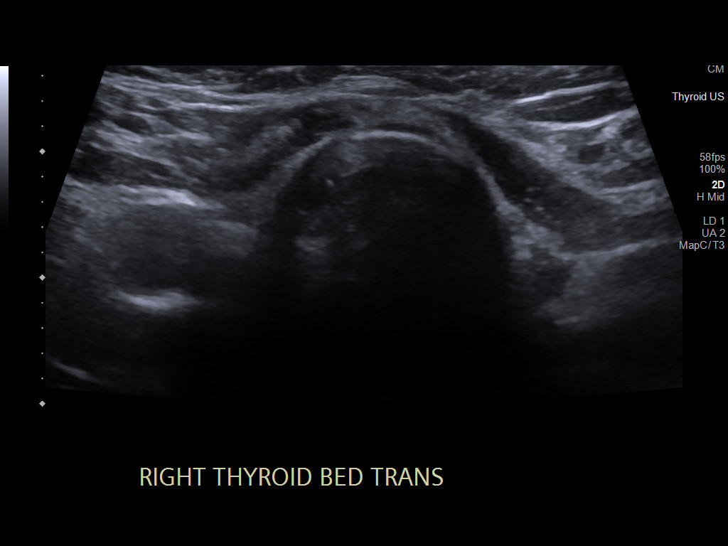
[im 4/42]
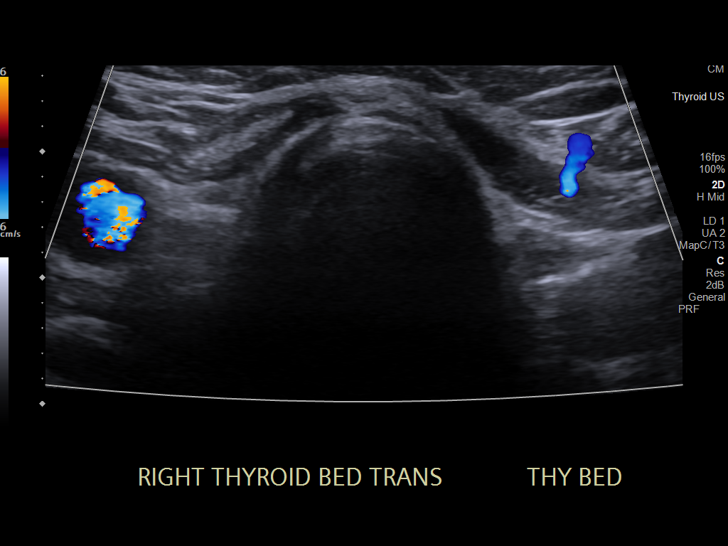
[im 7/42]
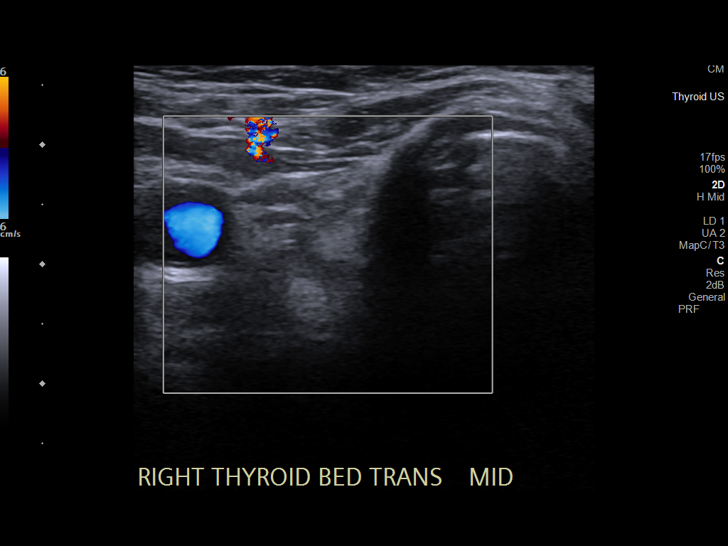
[im 11/42]
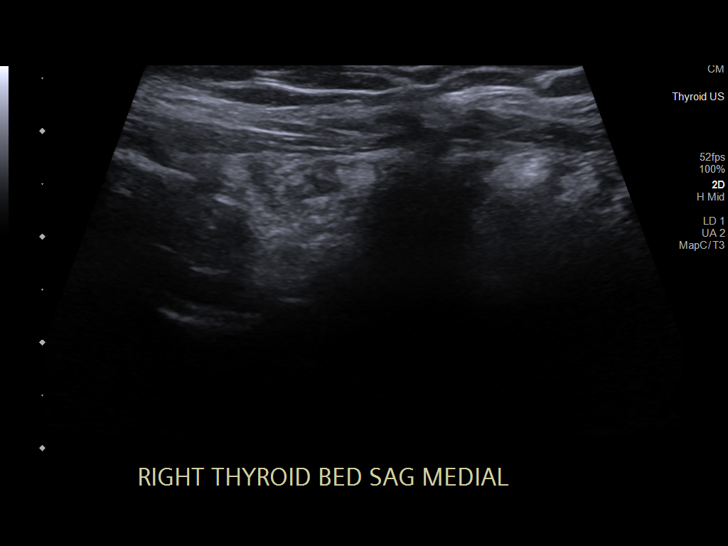
[im 14/42]
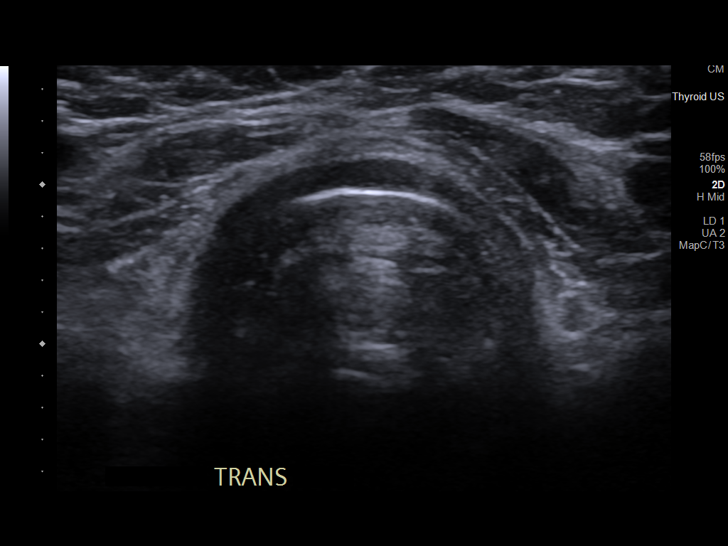
[im 16/42]
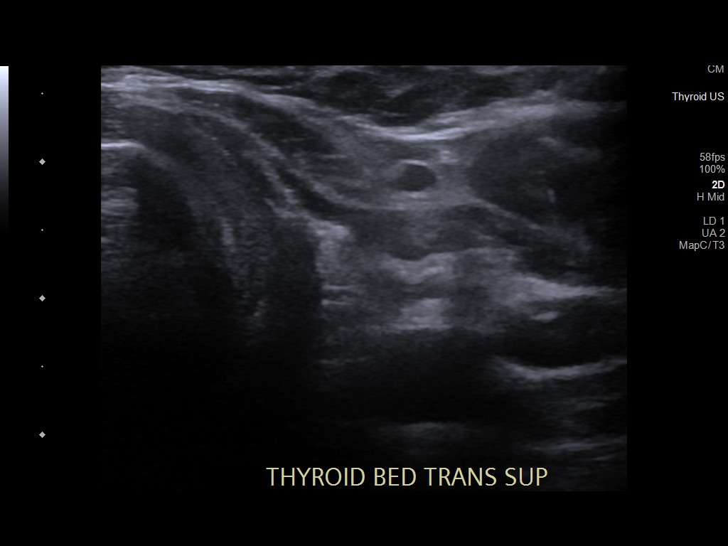
[im 19/42]
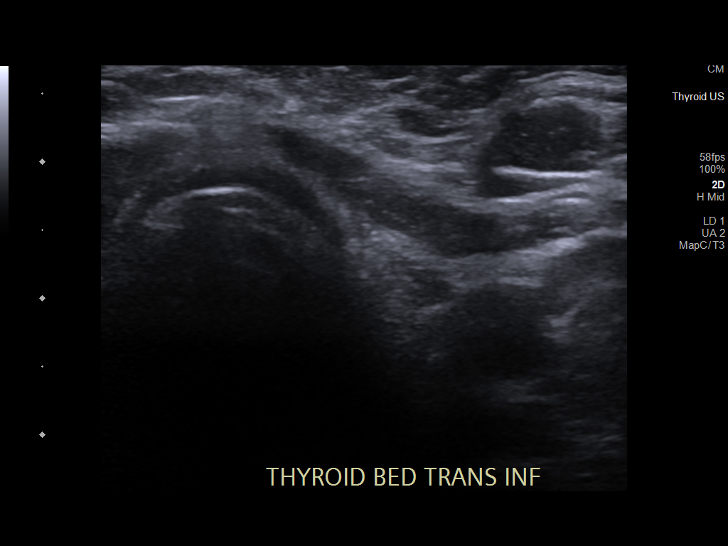
[im 23/42]
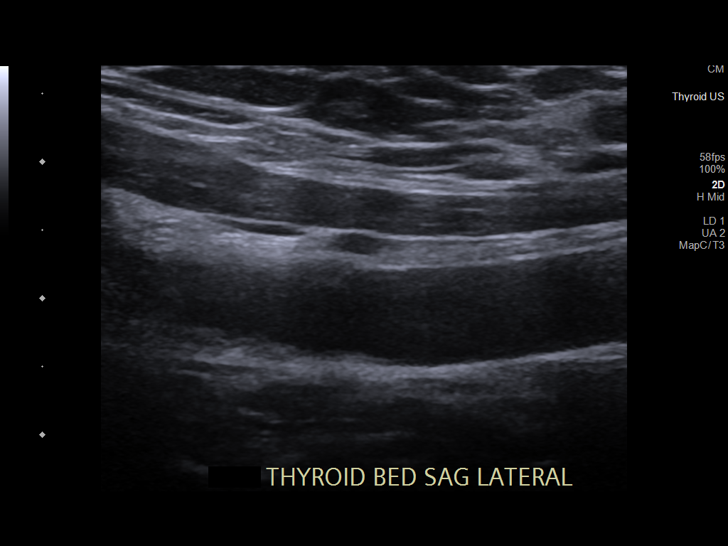
[im 26/42]
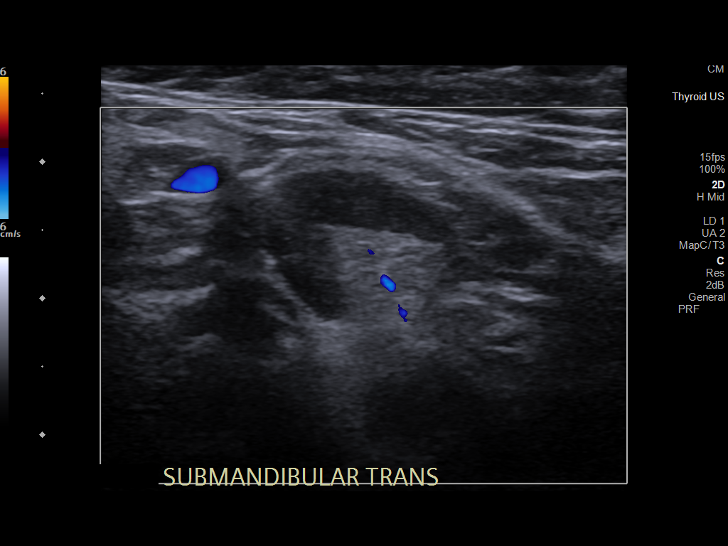
[im 28/42]
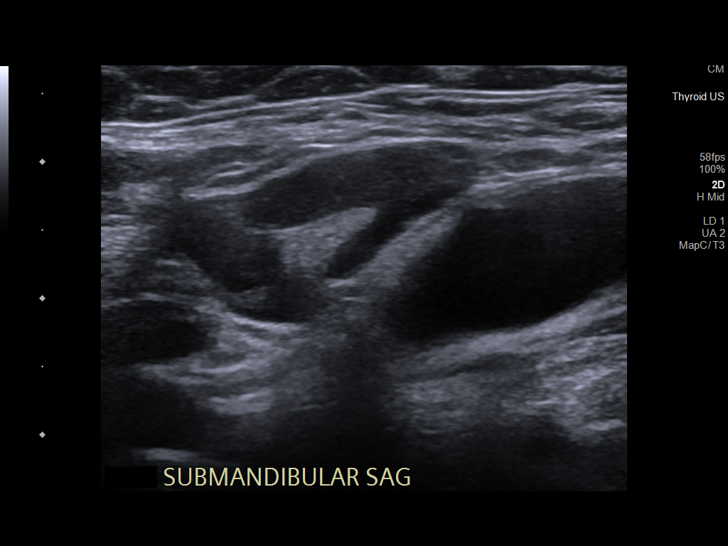
[im 31/42]
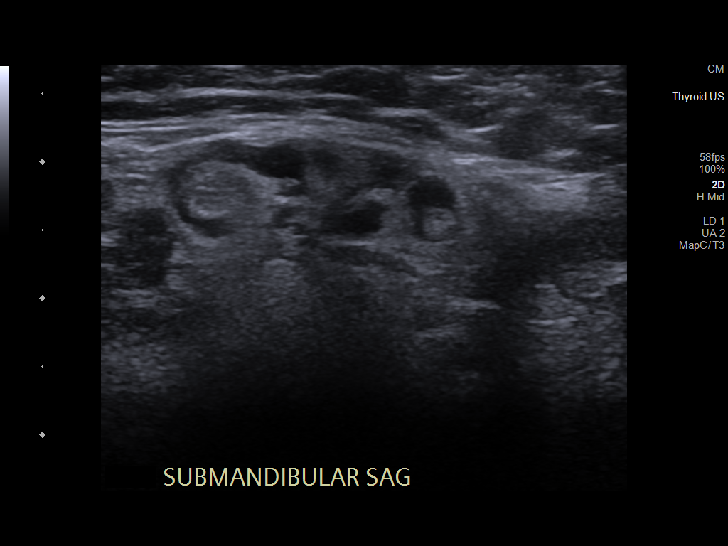
[im 35/42]
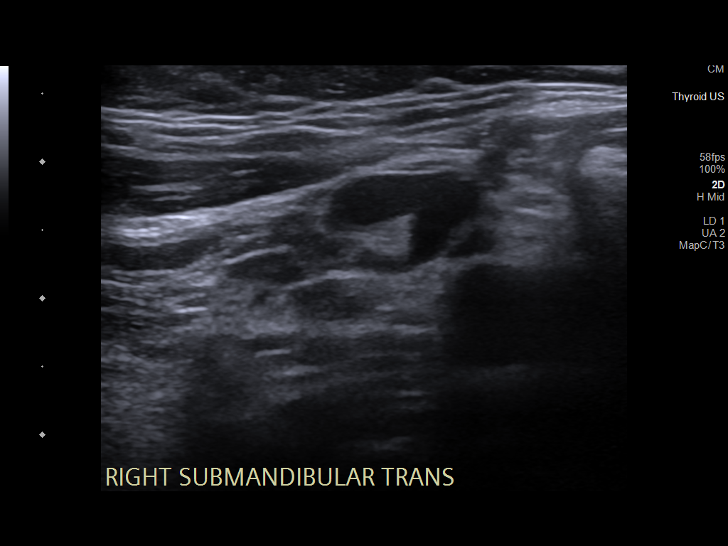
[im 38/42]
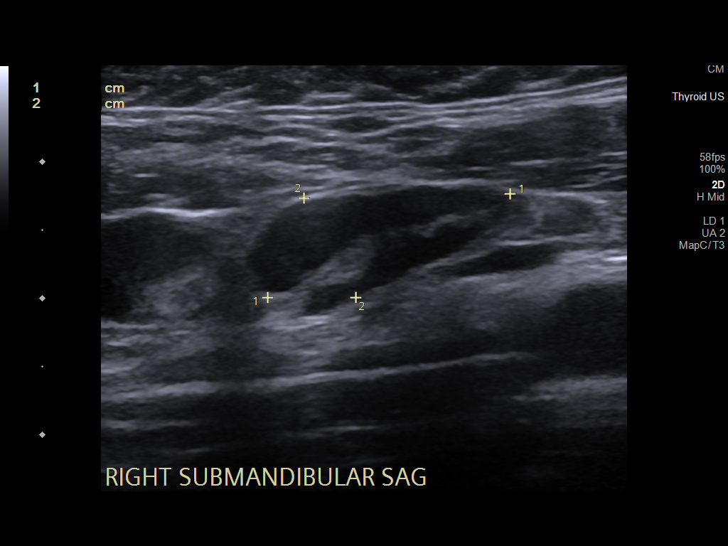
[im 42/42]
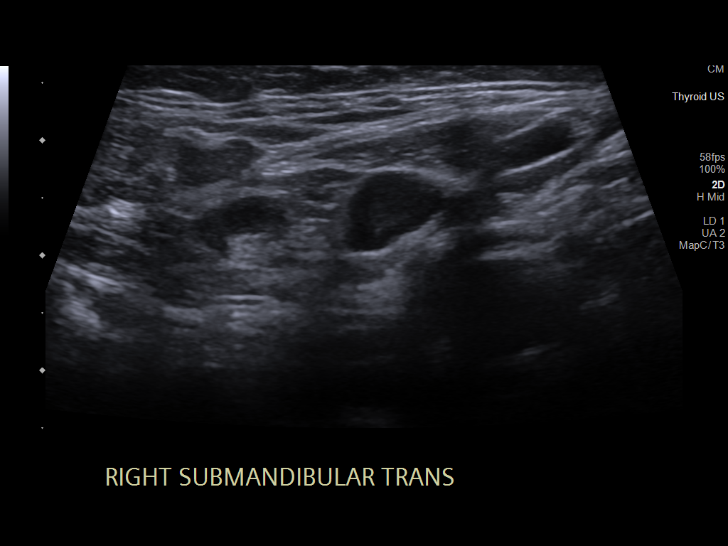

[14 of 25 positions shown; findings below may reference images not displayed]

FINDINGS: The thyroid gland is surgically absent. No soft tissue or nodularity
visible within the thyroid resection bed on either the right or
left. Incidental note is made of prominent, but not enlarged by
imaging criteria lymph nodes in the right and left submandibular
stations. Normal fatty hila are preserved.
IMPRESSION: 1. No evidence of residual or recurrent thyroid tissue or
nodularity.
2. Surgical changes of prior total thyroidectomy.
3. Prominence but not technically enlarged (by imaging criteria)
submandibular station lymph nodes.

## 2021-02-12 ENCOUNTER — Other Ambulatory Visit (HOSPITAL_COMMUNITY): Payer: Self-pay | Admitting: Internal Medicine

## 2021-02-12 DIAGNOSIS — N63 Unspecified lump in unspecified breast: Secondary | ICD-10-CM

## 2021-03-20 ENCOUNTER — Ambulatory Visit (HOSPITAL_COMMUNITY)
Admission: RE | Admit: 2021-03-20 | Discharge: 2021-03-20 | Disposition: A | Discharge status: Home / Self Care | Payer: Medicare Other | Source: Ambulatory Visit | Attending: Internal Medicine | Admitting: Internal Medicine

## 2021-03-20 DIAGNOSIS — N6311 Unspecified lump in the right breast, upper outer quadrant: Secondary | ICD-10-CM | POA: Diagnosis not present

## 2021-03-20 DIAGNOSIS — N6321 Unspecified lump in the left breast, upper outer quadrant: Secondary | ICD-10-CM | POA: Insufficient documentation

## 2021-03-20 DIAGNOSIS — N63 Unspecified lump in unspecified breast: Secondary | ICD-10-CM | POA: Diagnosis present

## 2021-03-20 DIAGNOSIS — N6489 Other specified disorders of breast: Secondary | ICD-10-CM | POA: Diagnosis not present

## 2021-03-20 DIAGNOSIS — N6313 Unspecified lump in the right breast, lower outer quadrant: Secondary | ICD-10-CM | POA: Diagnosis not present

## 2021-03-20 DIAGNOSIS — R928 Other abnormal and inconclusive findings on diagnostic imaging of breast: Secondary | ICD-10-CM | POA: Diagnosis not present

## 2021-06-20 ENCOUNTER — Other Ambulatory Visit: Payer: Self-pay | Admitting: "Endocrinology

## 2021-06-21 DIAGNOSIS — F329 Major depressive disorder, single episode, unspecified: Secondary | ICD-10-CM | POA: Insufficient documentation

## 2021-06-24 ENCOUNTER — Ambulatory Visit
Admission: EM | Admit: 2021-06-24 | Discharge: 2021-06-24 | Disposition: A | Discharge status: Home / Self Care | Payer: Medicare Other

## 2021-06-24 ENCOUNTER — Other Ambulatory Visit: Payer: Self-pay

## 2021-06-24 ENCOUNTER — Ambulatory Visit (INDEPENDENT_AMBULATORY_CARE_PROVIDER_SITE_OTHER): Payer: Medicare Other

## 2021-06-24 ENCOUNTER — Encounter: Payer: Self-pay | Admitting: Emergency Medicine

## 2021-06-24 DIAGNOSIS — R059 Cough, unspecified: Secondary | ICD-10-CM

## 2021-06-24 DIAGNOSIS — F17200 Nicotine dependence, unspecified, uncomplicated: Secondary | ICD-10-CM

## 2021-06-24 DIAGNOSIS — J208 Acute bronchitis due to other specified organisms: Secondary | ICD-10-CM | POA: Diagnosis not present

## 2021-06-24 DIAGNOSIS — J069 Acute upper respiratory infection, unspecified: Secondary | ICD-10-CM | POA: Diagnosis not present

## 2021-06-24 MED ORDER — ALBUTEROL SULFATE HFA 108 (90 BASE) MCG/ACT IN AERS: 1.0000 | INHALATION_SPRAY | Freq: Four times a day (QID) | RESPIRATORY_TRACT | 0 refills | Status: DC | PRN

## 2021-06-24 MED ORDER — AZITHROMYCIN 250 MG PO TABS: ORAL_TABLET | ORAL | 0 refills | Status: DC

## 2021-06-24 MED ORDER — BENZONATATE 100 MG PO CAPS: 100.0000 mg | ORAL_CAPSULE | Freq: Three times a day (TID) | ORAL | 0 refills | Status: DC | PRN

## 2021-06-24 MED ORDER — PROMETHAZINE-DM 6.25-15 MG/5ML PO SYRP: 5.0000 mL | ORAL_SOLUTION | Freq: Every evening | ORAL | 0 refills | Status: DC | PRN

## 2021-06-24 MED ORDER — PREDNISONE 20 MG PO TABS: ORAL_TABLET | ORAL | 0 refills | Status: DC

## 2021-06-24 NOTE — ED Triage Notes (Signed)
Coughing, left ear pain and white spots on back of throat, sore throat on left side.

## 2021-06-24 NOTE — ED Provider Notes (Signed)
[place]   MRN: [id number] DOB: [birth year]  Subjective:   Dana White is a 55 y.o. female presenting for 1 week history of persistent and worsening productive cough, chest congestion, nasal congestion, sinus inflammation with white spots on the back of her throat.  She has a longstanding history of smoking and is working on quitting.  She does not want COVID vaccination but is not opposed to testing.  Denies active chest pain or shortness of breath.  No current facility-administered medications for this encounter.  Current Outpatient Medications:    atorvastatin (LIPITOR) 10 MG tablet, Take 10 mg by mouth daily., Disp: , Rfl:    ALPRAZolam (XANAX) 0.5 MG tablet, Take 0.5 mg by mouth 2 (two) times daily as needed., Disp: , Rfl:    cyclobenzaprine (FLEXERIL) 10 MG tablet, Take 10 mg by mouth at bedtime., Disp: , Rfl:    levothyroxine (SYNTHROID) 125 MCG tablet, TAKE (1) TABLET BY MOUTH DAILY BEFORE BREAKFAST., Disp: 90 tablet, Rfl: 0   lisinopril (PRINIVIL,ZESTRIL) 5 MG tablet, Take 10 mg by mouth daily. , Disp: , Rfl:    sertraline (ZOLOFT) 25 MG tablet, Take 50 mg by mouth daily. , Disp: , Rfl:    No Known Allergies  Past Medical History:  Diagnosis Date   Cancer ([place])    papillary   Hypertension    Thyroid disease      Past Surgical History:  Procedure Laterality Date   APPENDECTOMY     THYROIDECTOMY      Family History  Problem Relation Age of Onset   Hypertension Mother    Hyperlipidemia Mother     Social History   Tobacco Use   Smoking status: Former    Pack years: 0.00   Smokeless tobacco: Never  Vaping Use   Vaping Use: Never used  Substance Use Topics   Alcohol use: No   Drug use: No    ROS   Objective:   Vitals: BP 125/75 (BP Location: Right Arm)   Pulse (!) 104   Temp 99.2 F (37.3 C) (Oral)   Resp 16   SpO2 93%   Physical Exam Constitutional:      General: She is not in acute distress.    Appearance: Normal  appearance. She is well-developed. She is not ill-appearing, toxic-appearing or diaphoretic.  HENT:     Head: Normocephalic and atraumatic.     Nose: Nose normal.     Mouth/Throat:     Mouth: Mucous membranes are moist.  Eyes:     Extraocular Movements: Extraocular movements intact.     Pupils: Pupils are equal, round, and reactive to light.  Cardiovascular:     Rate and Rhythm: Normal rate and regular rhythm.     Pulses: Normal pulses.     Heart sounds: Normal heart sounds. No murmur heard.   No friction rub. No gallop.  Pulmonary:     Effort: Pulmonary effort is normal. No respiratory distress.     Breath sounds: No stridor. No wheezing, rhonchi or rales.     Comments: Decreased lung sounds.  Chest:     Chest wall: No tenderness.  Skin:    General: Skin is warm and dry.     Findings: No rash.  Neurological:     Mental Status: She is alert and oriented to person, place, and time.  Psychiatric:        Mood and Affect: Mood normal.        Behavior: Behavior normal.  Thought Content: Thought content normal.        Judgment: Judgment normal.   DG Chest 2 View  Result Date: 06/24/2021 CLINICAL DATA:  Cough EXAM: CHEST - 2 VIEW COMPARISON:  None. FINDINGS: The cardiomediastinal silhouette is unremarkable. Mild interstitial opacities bilaterally are noted. There is no evidence of focal airspace disease, pulmonary edema, suspicious pulmonary nodule/mass, pleural effusion, or pneumothorax. No acute bony abnormalities are identified. IMPRESSION: Mild bilateral interstitial opacities of uncertain chronicity. This may represent chronic changes/interstitial lung disease or less likely atypical infection or edema. Electronically Signed   By: Margarette Canada M.D.   On: 06/24/2021 13:58    Assessment and Plan :   PDMP not reviewed this encounter.  1. Acute bronchitis due to other specified organisms   2. Cough   3. Smoker     Start azithromycin for acute bronchitis especially in light  of the interstitial opacities.  Recommended supportive care otherwise and a prednisone course in light of her smoking history.  COVID-19 testing pending. Counseled patient on potential for adverse effects with medications prescribed/recommended today, ER and return-to-clinic precautions discussed, patient verbalized understanding.    Jaynee Eagles, PA-C 06/24/21 [id number]

## 2021-06-24 NOTE — ED Triage Notes (Signed)
Coughing up green sputum and green nasal congestion.

## 2021-06-25 LAB — SARS-COV-2, NAA 2 DAY TAT

## 2021-06-25 LAB — NOVEL CORONAVIRUS, NAA: SARS-CoV-2, NAA: NOT DETECTED

## 2021-07-17 DIAGNOSIS — E782 Mixed hyperlipidemia: Secondary | ICD-10-CM | POA: Insufficient documentation

## 2021-07-17 DIAGNOSIS — I10 Essential (primary) hypertension: Secondary | ICD-10-CM | POA: Insufficient documentation

## 2021-07-24 DIAGNOSIS — E0590 Thyrotoxicosis, unspecified without thyrotoxic crisis or storm: Secondary | ICD-10-CM | POA: Diagnosis not present

## 2021-07-24 DIAGNOSIS — I10 Essential (primary) hypertension: Secondary | ICD-10-CM | POA: Diagnosis not present

## 2021-07-24 DIAGNOSIS — R7301 Impaired fasting glucose: Secondary | ICD-10-CM | POA: Diagnosis not present

## 2021-07-26 DIAGNOSIS — R7303 Prediabetes: Secondary | ICD-10-CM | POA: Diagnosis not present

## 2021-07-26 DIAGNOSIS — F633 Trichotillomania: Secondary | ICD-10-CM | POA: Diagnosis not present

## 2021-07-26 DIAGNOSIS — Z85850 Personal history of malignant neoplasm of thyroid: Secondary | ICD-10-CM | POA: Diagnosis not present

## 2021-07-26 DIAGNOSIS — I10 Essential (primary) hypertension: Secondary | ICD-10-CM | POA: Diagnosis not present

## 2021-07-26 DIAGNOSIS — D72828 Other elevated white blood cell count: Secondary | ICD-10-CM | POA: Diagnosis not present

## 2021-07-26 DIAGNOSIS — R7401 Elevation of levels of liver transaminase levels: Secondary | ICD-10-CM | POA: Diagnosis not present

## 2021-07-26 DIAGNOSIS — F419 Anxiety disorder, unspecified: Secondary | ICD-10-CM | POA: Insufficient documentation

## 2021-07-26 DIAGNOSIS — E6609 Other obesity due to excess calories: Secondary | ICD-10-CM | POA: Diagnosis not present

## 2021-07-26 DIAGNOSIS — E559 Vitamin D deficiency, unspecified: Secondary | ICD-10-CM | POA: Diagnosis not present

## 2021-07-26 DIAGNOSIS — E782 Mixed hyperlipidemia: Secondary | ICD-10-CM | POA: Diagnosis not present

## 2021-07-26 DIAGNOSIS — E039 Hypothyroidism, unspecified: Secondary | ICD-10-CM | POA: Diagnosis not present

## 2021-07-26 DIAGNOSIS — M542 Cervicalgia: Secondary | ICD-10-CM | POA: Diagnosis not present

## 2021-07-26 DIAGNOSIS — E669 Obesity, unspecified: Secondary | ICD-10-CM | POA: Insufficient documentation

## 2021-08-01 ENCOUNTER — Other Ambulatory Visit: Payer: Self-pay | Admitting: Family Medicine

## 2021-08-01 ENCOUNTER — Other Ambulatory Visit (HOSPITAL_COMMUNITY): Payer: Self-pay | Admitting: Family Medicine

## 2021-08-01 DIAGNOSIS — R7401 Elevation of levels of liver transaminase levels: Secondary | ICD-10-CM

## 2021-08-08 ENCOUNTER — Other Ambulatory Visit: Payer: Self-pay

## 2021-08-08 ENCOUNTER — Ambulatory Visit (HOSPITAL_COMMUNITY)
Admission: RE | Admit: 2021-08-08 | Discharge: 2021-08-08 | Disposition: A | Discharge status: Home / Self Care | Payer: Medicare Other | Source: Ambulatory Visit | Attending: Family Medicine | Admitting: Family Medicine

## 2021-08-08 DIAGNOSIS — R7401 Elevation of levels of liver transaminase levels: Secondary | ICD-10-CM | POA: Diagnosis not present

## 2021-09-18 ENCOUNTER — Other Ambulatory Visit: Payer: Self-pay | Admitting: "Endocrinology

## 2021-10-12 IMAGING — DX DG CHEST 2V
2 series · 2 of 2 positions shown · non-contrast
Comparison: None.

CLINICAL DATA: Cough

EXAM:
CHEST - 2 VIEW

[chest pa]
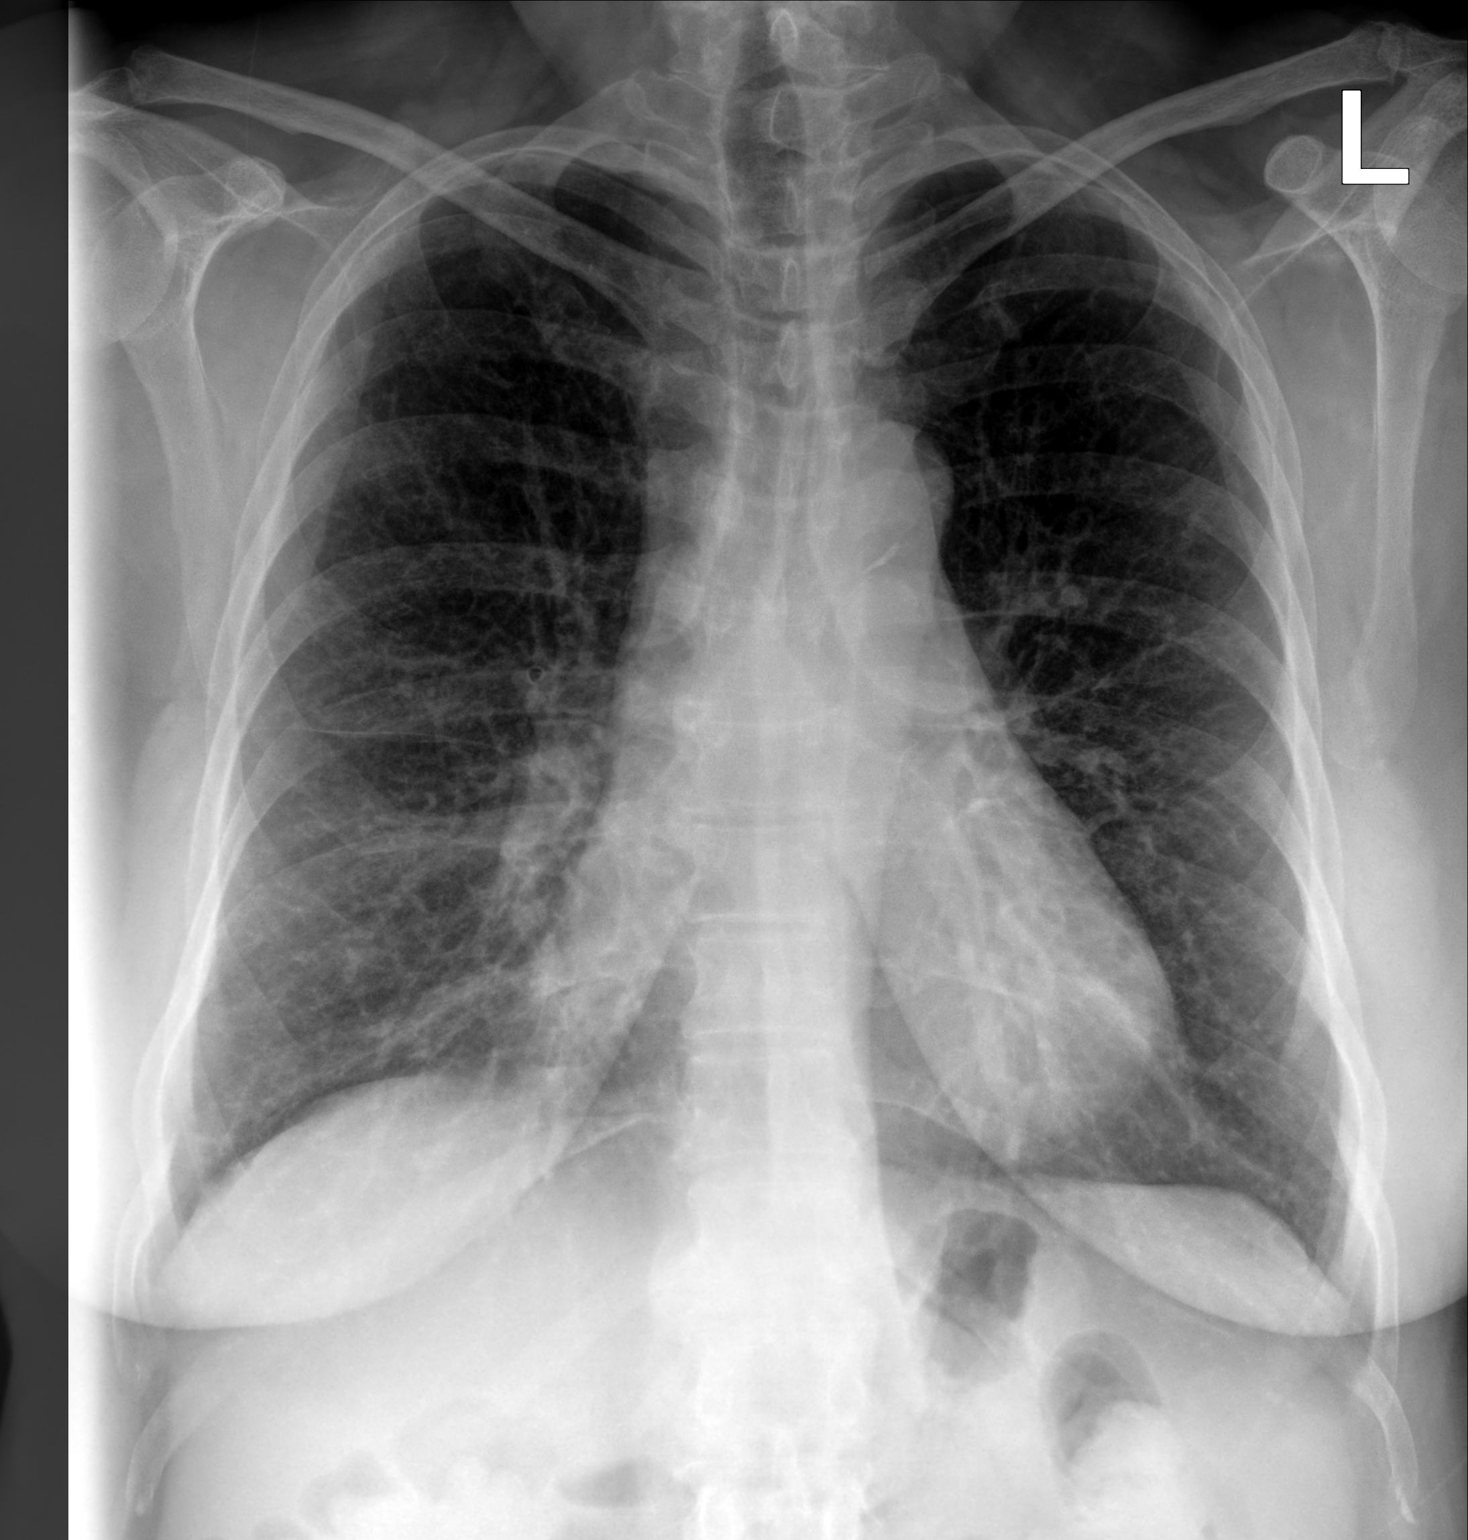

[chest lat]
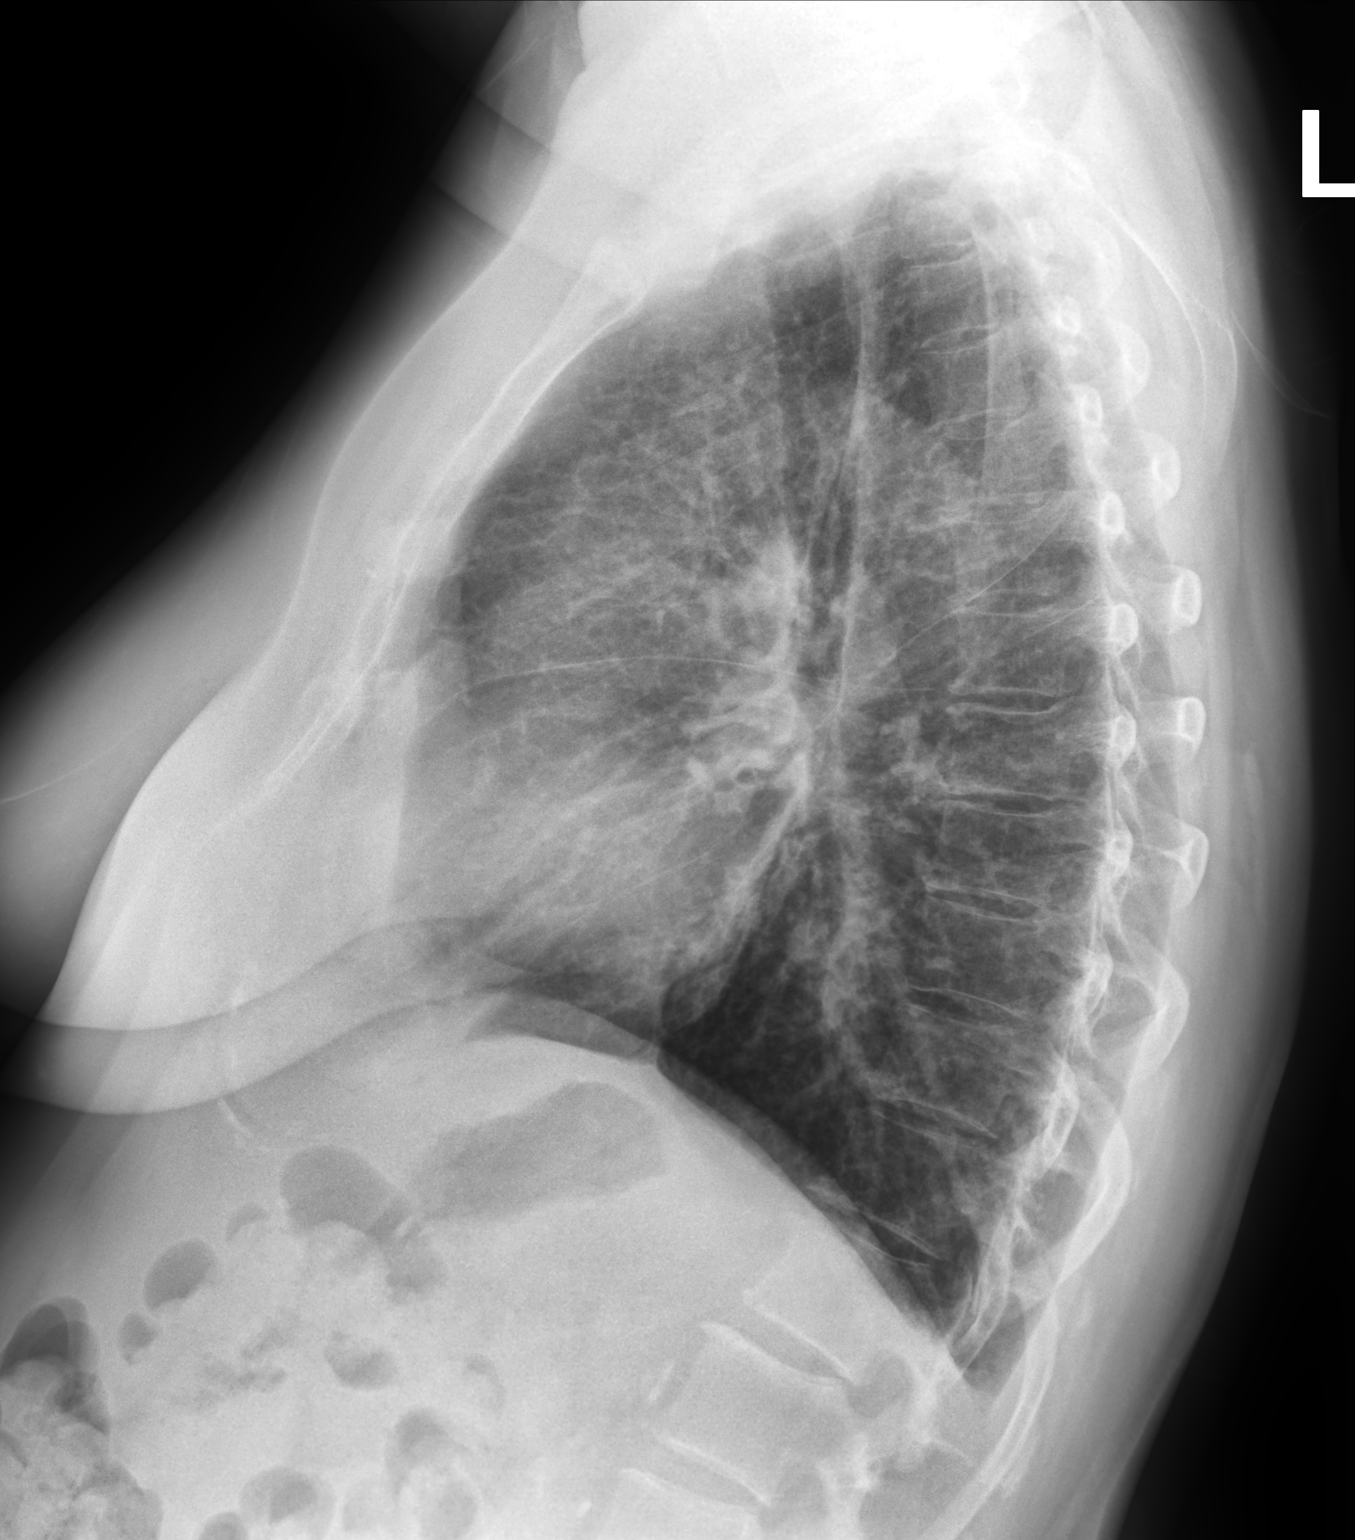

[2 of 2 positions shown; findings below may reference images not displayed]

FINDINGS: The cardiomediastinal silhouette is unremarkable.

Mild interstitial opacities bilaterally are noted.

There is no evidence of focal airspace disease, pulmonary edema,
suspicious pulmonary nodule/mass, pleural effusion, or pneumothorax.

No acute bony abnormalities are identified.
IMPRESSION: Mild bilateral interstitial opacities of uncertain chronicity. This
may represent chronic changes/interstitial lung disease or less
likely atypical infection or edema.

## 2021-10-29 DIAGNOSIS — F329 Major depressive disorder, single episode, unspecified: Secondary | ICD-10-CM | POA: Diagnosis not present

## 2021-10-29 DIAGNOSIS — Z23 Encounter for immunization: Secondary | ICD-10-CM | POA: Diagnosis not present

## 2021-10-29 DIAGNOSIS — F633 Trichotillomania: Secondary | ICD-10-CM | POA: Diagnosis not present

## 2021-10-29 DIAGNOSIS — E669 Obesity, unspecified: Secondary | ICD-10-CM | POA: Diagnosis not present

## 2021-11-14 ENCOUNTER — Other Ambulatory Visit: Payer: Self-pay | Admitting: "Endocrinology

## 2021-11-14 DIAGNOSIS — E890 Postprocedural hypothyroidism: Secondary | ICD-10-CM

## 2021-11-21 ENCOUNTER — Ambulatory Visit: Payer: Medicare Other | Admitting: "Endocrinology

## 2021-11-26 IMAGING — US US ABDOMEN LIMITED
1 series · 14 of 25 positions shown · non-contrast
Comparison: Remote abdominal ultrasound 08/07/2010

CLINICAL DATA: Elevation of levels of liver transaminase levels.

EXAM:
ULTRASOUND ABDOMEN LIMITED RIGHT UPPER QUADRANT

[Series 1: us abdomen limited ruq (liver/gb) · 14 of 56 slices shown]
[im 1/56]
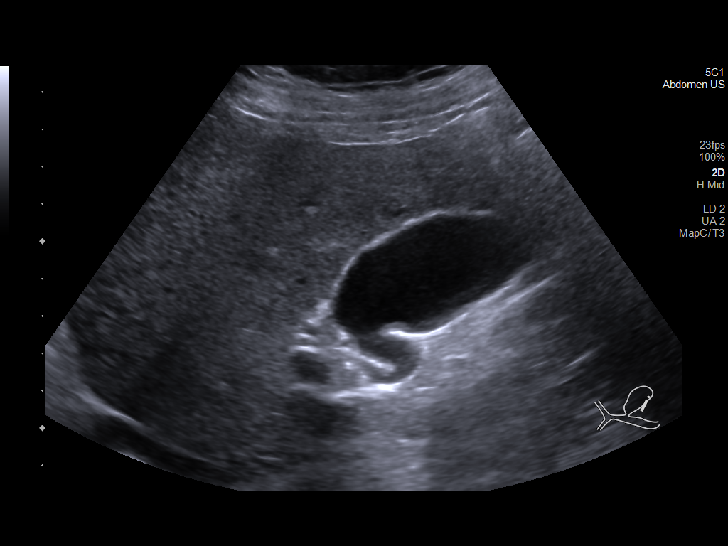
[im 5/56]
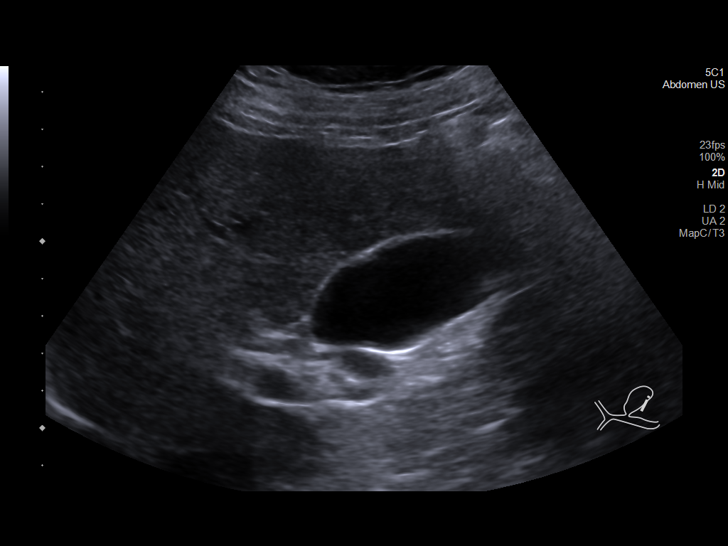
[im 10/56]
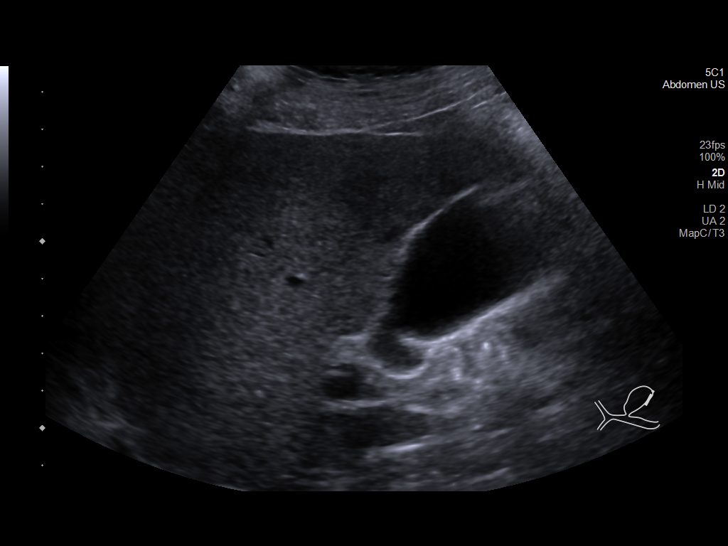
[im 14/56]
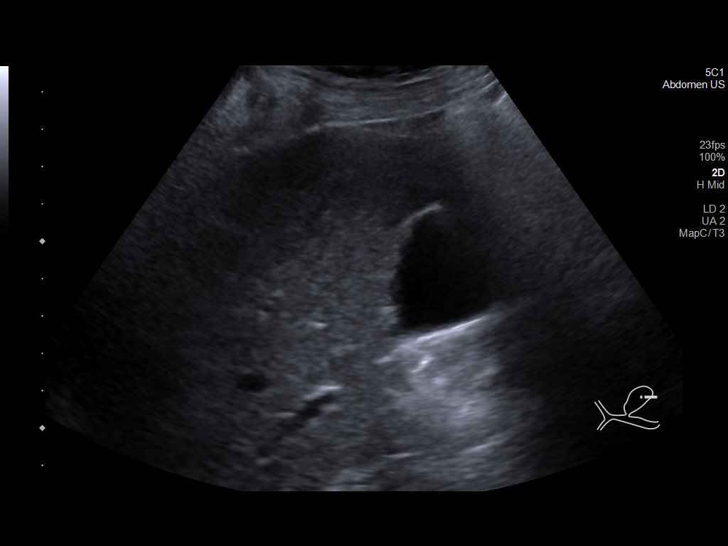
[im 19/56]
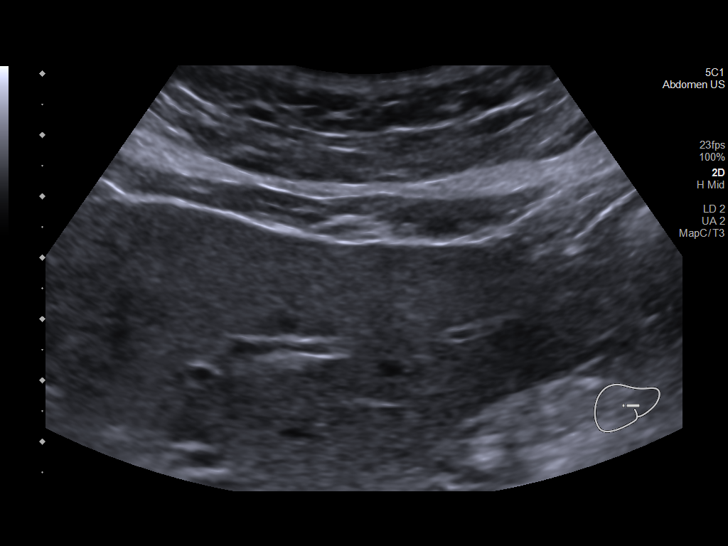
[im 21/56]
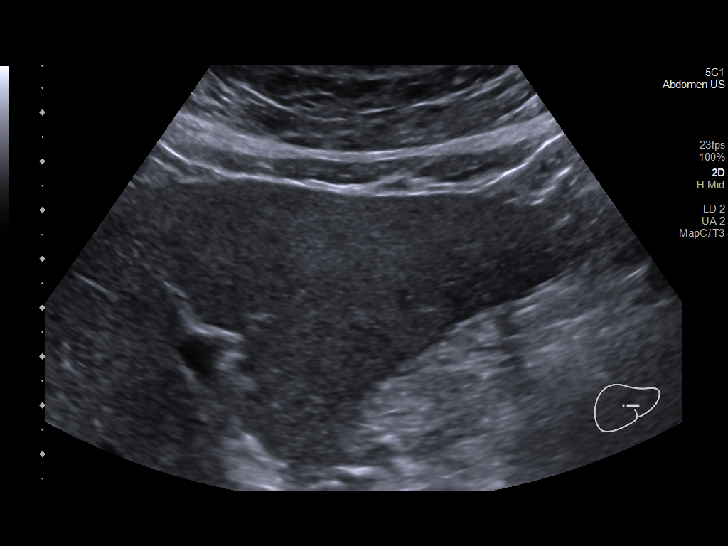
[im 26/56]
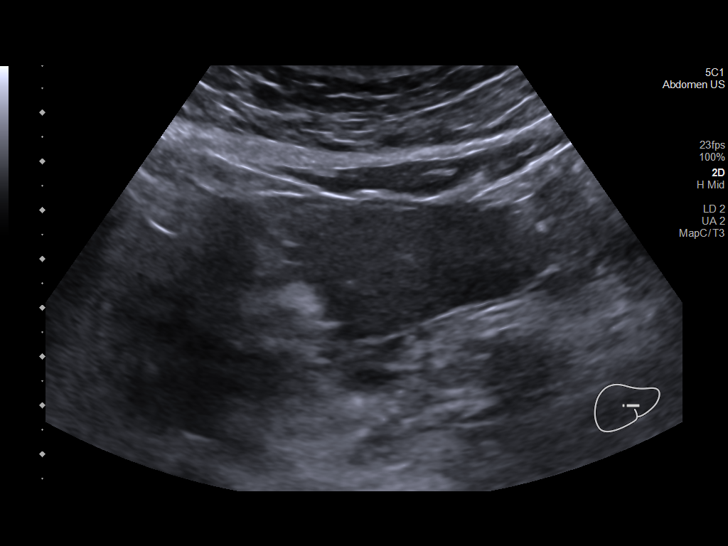
[im 30/56]
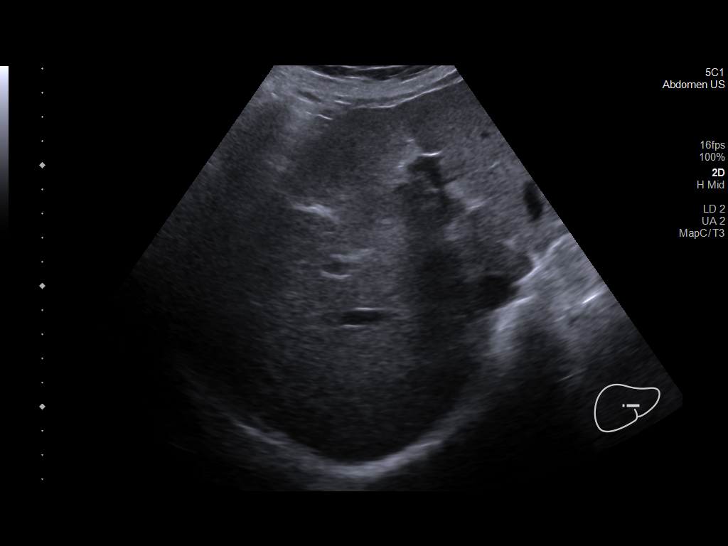
[im 35/56]
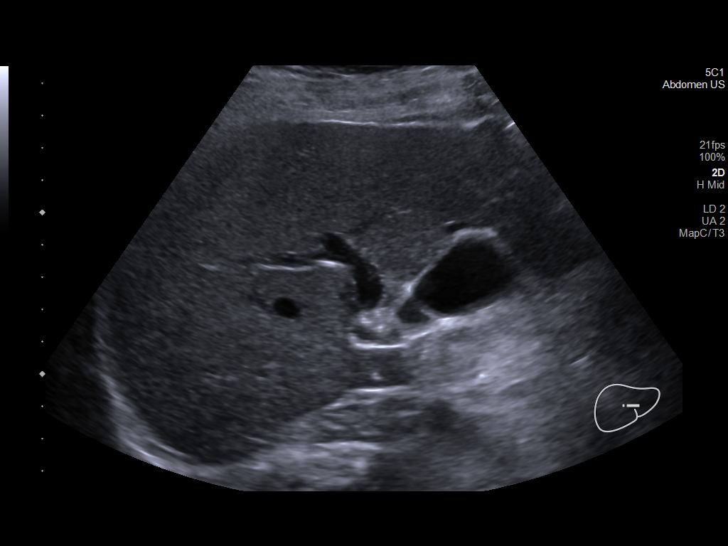
[im 37/56]
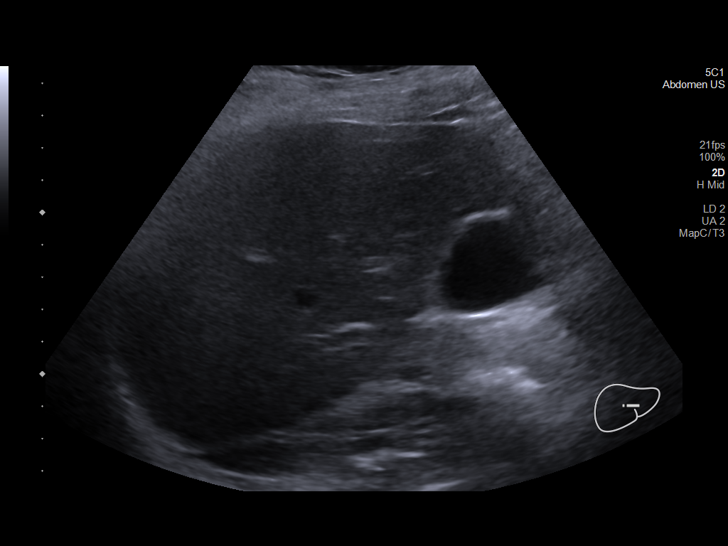
[im 42/56]
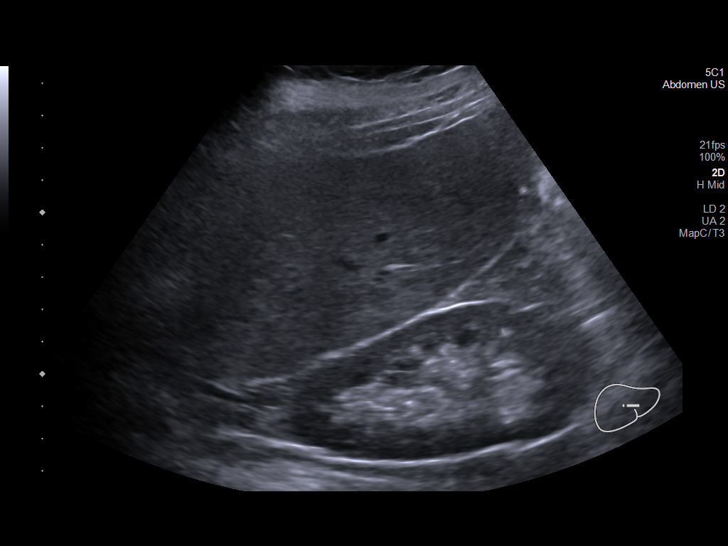
[im 46/56]
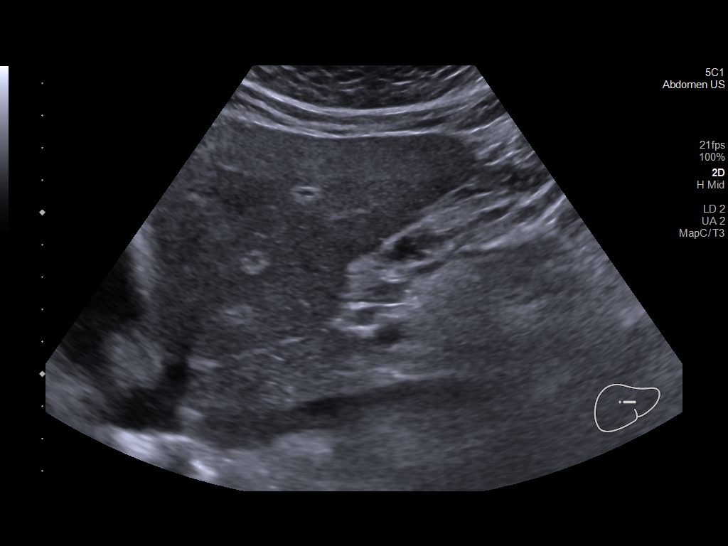
[im 51/56]
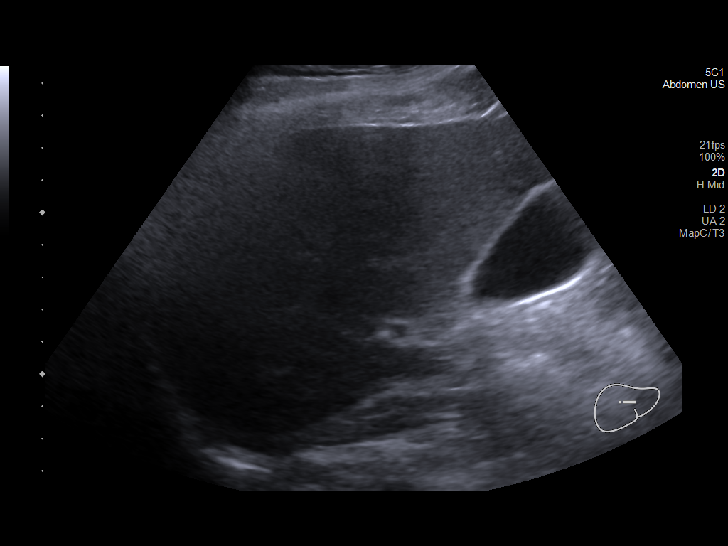
[im 56/56]
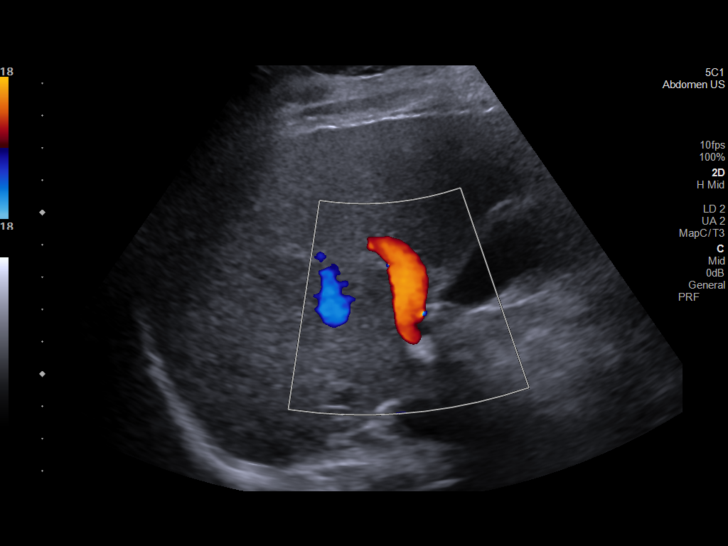

[14 of 25 positions shown; findings below may reference images not displayed]

FINDINGS: Gallbladder:

Physiologically distended. No gallstones or wall thickening
visualized. Previous focus of ring down artifact is not seen on the
current exam. No sonographic Murphy sign noted by sonographer.

Common bile duct:

Diameter: 4 mm, normal.

Liver:

No focal lesion identified. Mildly increased in parenchymal
echogenicity compared to right kidney. There is no capsular
nodularity. Portal vein is patent on color Doppler imaging with
normal direction of blood flow towards the liver.

Other: No right upper quadrant ascites.
IMPRESSION: 1. Mild increased hepatic parenchymal echogenicity typical of
steatosis. No focal hepatic lesion.
2. Normal sonographic appearance of the gallbladder and biliary
tree.

## 2021-12-18 ENCOUNTER — Telehealth: Payer: Self-pay | Admitting: "Endocrinology

## 2021-12-18 ENCOUNTER — Other Ambulatory Visit: Payer: Self-pay | Admitting: "Endocrinology

## 2021-12-18 NOTE — Telephone Encounter (Signed)
Pt called and is needing a refill on levothyroxine (SYNTHROID) 125 MCG tablet [  [place], [place] - [place] Phone:  [phone]

## 2021-12-18 NOTE — Telephone Encounter (Signed)
I have sent a 30 day supply for this patient since it has been well over a year since we have seen her or since she had her labs done.

## 2022-01-09 ENCOUNTER — Other Ambulatory Visit: Payer: Self-pay

## 2022-01-09 DIAGNOSIS — E890 Postprocedural hypothyroidism: Secondary | ICD-10-CM

## 2022-01-10 DIAGNOSIS — E890 Postprocedural hypothyroidism: Secondary | ICD-10-CM | POA: Diagnosis not present

## 2022-01-10 LAB — T4, FREE: Free T4: 1.4 ng/dL (ref 0.8–1.8)

## 2022-01-10 LAB — TSH: TSH: 1.37 mIU/L (ref 0.40–4.50)

## 2022-01-17 ENCOUNTER — Other Ambulatory Visit: Payer: Self-pay

## 2022-01-17 ENCOUNTER — Encounter: Payer: Self-pay | Admitting: "Endocrinology

## 2022-01-17 ENCOUNTER — Ambulatory Visit (INDEPENDENT_AMBULATORY_CARE_PROVIDER_SITE_OTHER): Payer: Medicare Other | Admitting: "Endocrinology

## 2022-01-17 VITALS — BP 126/78 | HR 68 | Ht 60.0 in | Wt 158.4 lb

## 2022-01-17 DIAGNOSIS — E890 Postprocedural hypothyroidism: Secondary | ICD-10-CM | POA: Diagnosis not present

## 2022-01-17 DIAGNOSIS — Z85850 Personal history of malignant neoplasm of thyroid: Secondary | ICD-10-CM | POA: Diagnosis not present

## 2022-01-17 MED ORDER — LEVOTHYROXINE SODIUM 125 MCG PO TABS: ORAL_TABLET | ORAL | 3 refills | Status: DC

## 2022-01-17 NOTE — Progress Notes (Signed)
01/17/2022                Endocrinology follow-up note   Subjective:    Patient ID: Dana White, female    DOB: 1966-03-20,    Past Medical History:  Diagnosis Date   Cancer ([place])    papillary   Hypertension    Thyroid disease    Past Surgical History:  Procedure Laterality Date   APPENDECTOMY     THYROIDECTOMY     Social History   Socioeconomic History   Marital status: Married    Spouse name: Not on file   Number of children: Not on file   Years of education: Not on file   Highest education level: Not on file  Occupational History   Not on file  Tobacco Use   Smoking status: Former   Smokeless tobacco: Never  Vaping Use   Vaping Use: Never used  Substance and Sexual Activity   Alcohol use: No   Drug use: No   Sexual activity: Not on file  Other Topics Concern   Not on file  Social History Narrative   Not on file   Social Determinants of Health   Financial Resource Strain: Not on file  Food Insecurity: Not on file  Transportation Needs: Not on file  Physical Activity: Not on file  Stress: Not on file  Social Connections: Not on file   Outpatient Encounter Medications as of 01/17/2022  Medication Sig   ALPRAZolam (XANAX) 0.5 MG tablet Take 0.5 mg by mouth 2 (two) times daily as needed.   atorvastatin (LIPITOR) 40 MG tablet Take 1 tablet by mouth daily.   cyclobenzaprine (FLEXERIL) 10 MG tablet Take 10 mg by mouth at bedtime.   levothyroxine (SYNTHROID) 125 MCG tablet TAKE (1) TABLET BY MOUTH DAILY BEFORE BREAKFAST.   lisinopril (ZESTRIL) 10 MG tablet Take 10 mg by mouth daily.   sertraline (ZOLOFT) 100 MG tablet Take 100 mg by mouth daily.   [DISCONTINUED] albuterol (VENTOLIN HFA) 108 (90 Base) MCG/ACT inhaler Inhale 1-2 puffs into the lungs every 6 (six) hours as needed for wheezing or shortness of breath.   [DISCONTINUED] atorvastatin (LIPITOR) 10 MG tablet Take 10 mg by mouth daily.   [DISCONTINUED] azithromycin (ZITHROMAX) 250 MG tablet Take  first 2 tablets together on day 1, then 1 tablet every day until finished.   [DISCONTINUED] benzonatate (TESSALON) 100 MG capsule Take 1-2 capsules (100-200 mg total) by mouth 3 (three) times daily as needed for cough.   [DISCONTINUED] levothyroxine (SYNTHROID) 125 MCG tablet TAKE (1) TABLET BY MOUTH DAILY BEFORE BREAKFAST.   [DISCONTINUED] lisinopril (PRINIVIL,ZESTRIL) 5 MG tablet Take 10 mg by mouth daily.    [DISCONTINUED] predniSONE (DELTASONE) 20 MG tablet Take 2 tablets daily with breakfast.   [DISCONTINUED] promethazine-dextromethorphan (PROMETHAZINE-DM) 6.25-15 MG/5ML syrup Take 5 mLs by mouth at bedtime as needed for cough.   [DISCONTINUED] sertraline (ZOLOFT) 25 MG tablet Take 50 mg by mouth daily.    No facility-administered encounter medications on file as of 01/17/2022.   ALLERGIES: No Known Allergies VACCINATION STATUS:  There is no immunization history on file for this patient.  HPI  56 yo female with medical h/o PTC s/p near total thyroidectomy in June 2009. and I-131 therapy 156mi on 07/20/2008. She is currently on Synthroid 125 mcg p.o. every morning.  She reports compliance, no new complaints today.    Her last ( June 2015) as well as prior rTSH WBS was negative for any evidence of  thyroid cancer  in October 2012 and her most recent thyroid u/s in May 2014 is negative for any residual thyroid tissue.  -She has had  surveillance thyroid/neck ultrasound on Apr 30, 2018 which showed surgically absent thyroid, benign-appearing cervical lymphadenopathy.   -Her most recent thyroid ultrasound on 10/16/2020 also showed surgically absent thyroid, unchanged benign-appearing cervical lymphadenopathy.  no interval issues. She denies dysphagia, shortness of breath, nor voice change. she returns for f/u of her hypothyroidism. she has successfully quit smoking. -She has no new complaints today.  She remains consistent and compliant with her thyroid hormone replacement currently at 125 mcg  p.o. daily before breakfast.  Review of Systems Limited as above.  Objective:    BP 126/78    Pulse 68    Ht 5' (1.524 m)    Wt 158 lb 6.4 oz (71.8 kg)    BMI 30.94 kg/m   Wt Readings from Last 3 Encounters:  01/17/22 158 lb 6.4 oz (71.8 kg)  11/21/20 160 lb (72.6 kg)  05/18/20 158 lb (71.7 kg)    Physical Exam   Results for orders placed or performed in visit on 01/09/22  T4, Free  Result Value Ref Range   Free T4 1.4 0.8 - 1.8 ng/dL  TSH  Result Value Ref Range   TSH 1.37 0.40 - 4.50 mIU/L   Complete Blood Count (Most recent): Lab Results  Component Value Date   WBC 10.6 (H) 04/22/2012   HGB 15.3 (H) 04/22/2012   HCT 44.6 04/22/2012   MCV 90.1 04/22/2012   PLT 295 04/22/2012   Chemistry (most recent): Lab Results  Component Value Date   NA 138 04/22/2012   K 4.0 04/22/2012   CL 100 04/22/2012   CO2 26 04/22/2012   BUN 8 04/22/2012   CREATININE 0.51 04/22/2012    Thyroid ultrasound on Apr 30, 2018 IMPRESSION: 1. No definitive sonographic evidence of residual or locally recurrent disease. 2. Prominent though non pathologically enlarged and benign appearing bilateral cervical lymph nodes presumably reactive etiology   Thyroid ultrasound on 10/16/2020 IMPRESSION: 1. No evidence of residual or recurrent thyroid tissue or nodularity. 2. Surgical changes of prior total thyroidectomy. 3. Prominence but not technically enlarged (by imaging criteria) submandibular station lymph nodes.   Assessment & Plan:   1. Hypothyroidism : Postsurgical  -Her previsit thyroid function tests are consistent with appropriate replacement.  She is advised to continue Synthroid 125 mcg p.o. daily before breakfast.     - We discussed about the correct intake of her thyroid hormone, on empty stomach at fasting, with water, separated by at least 30 minutes from breakfast and other medications,  and separated by more than 4 hours from calcium, iron, multivitamins, acid reflux  medications (PPIs). -Patient is made aware of the fact that thyroid hormone replacement is needed for life, dose to be adjusted by periodic monitoring of thyroid function tests.  2. Malignant neoplasm of thyroid gland  Her last thyrogen stimulated whole-body scan FROM JUNE 2015 was negative for tumor recurrence. Stimulated TG level is 0.2 ( 2.8-40.9). Her last thyroid u/s from May 2014  was negative for any residual thyroid tissue. - She needed subsequent surveillance imaging, however this is delayed due to the fact that she does not have insurance.   Neck exam is unremarkable, however, her repeat neck/thyroid ultrasound, is significant for surgically absent thyroid, however benign-appearing cervical lymphadenopathy.  This study was repeated on November 2021 showing same findings.  She does not need any further investigation for tumor  recurrence.     She is advised to maintain close follow-up with her PCP Dr. Nevada Crane.   I spent 21 minutes in the care of the patient today including review of labs from Thyroid Function, CMP, and other relevant labs ; imaging/biopsy records (current and previous including abstractions from other facilities); face-to-face time discussing  her lab results and symptoms, medications doses, her options of short and long term treatment based on the latest standards of care / guidelines;   and documenting the encounter.  Christal D Schachter  participated in the discussions, expressed understanding, and voiced agreement with the above plans.  All questions were answered to her satisfaction. she is encouraged to contact clinic should she have any questions or concerns prior to her return visit.   Follow up plan: Return in about 1 year (around 01/17/2023) for F/U with Pre-visit Labs.  Glade Lloyd, MD Phone: [phone]  Fax: [phone]  -  This note was partially dictated with voice recognition software. Similar sounding words can be transcribed inadequately or may not  be  corrected upon review.  01/17/2022, 4:54 PM

## 2022-01-24 DIAGNOSIS — I10 Essential (primary) hypertension: Secondary | ICD-10-CM | POA: Diagnosis not present

## 2022-01-24 DIAGNOSIS — E0590 Thyrotoxicosis, unspecified without thyrotoxic crisis or storm: Secondary | ICD-10-CM | POA: Diagnosis not present

## 2022-01-24 DIAGNOSIS — E559 Vitamin D deficiency, unspecified: Secondary | ICD-10-CM | POA: Diagnosis not present

## 2022-01-24 DIAGNOSIS — R7303 Prediabetes: Secondary | ICD-10-CM | POA: Diagnosis not present

## 2022-01-28 DIAGNOSIS — E669 Obesity, unspecified: Secondary | ICD-10-CM | POA: Diagnosis not present

## 2022-01-28 DIAGNOSIS — I10 Essential (primary) hypertension: Secondary | ICD-10-CM | POA: Diagnosis not present

## 2022-01-28 DIAGNOSIS — K760 Fatty (change of) liver, not elsewhere classified: Secondary | ICD-10-CM | POA: Insufficient documentation

## 2022-01-28 DIAGNOSIS — F633 Trichotillomania: Secondary | ICD-10-CM | POA: Diagnosis not present

## 2022-01-28 DIAGNOSIS — E039 Hypothyroidism, unspecified: Secondary | ICD-10-CM | POA: Diagnosis not present

## 2022-01-28 DIAGNOSIS — D72828 Other elevated white blood cell count: Secondary | ICD-10-CM | POA: Diagnosis not present

## 2022-01-28 DIAGNOSIS — F419 Anxiety disorder, unspecified: Secondary | ICD-10-CM | POA: Diagnosis not present

## 2022-01-28 DIAGNOSIS — Z85850 Personal history of malignant neoplasm of thyroid: Secondary | ICD-10-CM | POA: Diagnosis not present

## 2022-01-28 DIAGNOSIS — F329 Major depressive disorder, single episode, unspecified: Secondary | ICD-10-CM | POA: Diagnosis not present

## 2022-01-28 DIAGNOSIS — E559 Vitamin D deficiency, unspecified: Secondary | ICD-10-CM | POA: Diagnosis not present

## 2022-01-28 DIAGNOSIS — E782 Mixed hyperlipidemia: Secondary | ICD-10-CM | POA: Diagnosis not present

## 2022-01-28 DIAGNOSIS — R7303 Prediabetes: Secondary | ICD-10-CM | POA: Diagnosis not present

## 2022-01-28 DIAGNOSIS — M542 Cervicalgia: Secondary | ICD-10-CM | POA: Diagnosis not present

## 2022-04-25 DIAGNOSIS — I10 Essential (primary) hypertension: Secondary | ICD-10-CM | POA: Diagnosis not present

## 2022-04-25 DIAGNOSIS — F633 Trichotillomania: Secondary | ICD-10-CM | POA: Diagnosis not present

## 2022-04-29 ENCOUNTER — Other Ambulatory Visit: Payer: Self-pay | Admitting: "Endocrinology

## 2022-07-25 DIAGNOSIS — E559 Vitamin D deficiency, unspecified: Secondary | ICD-10-CM | POA: Diagnosis not present

## 2022-07-25 DIAGNOSIS — E782 Mixed hyperlipidemia: Secondary | ICD-10-CM | POA: Diagnosis not present

## 2022-07-25 DIAGNOSIS — E039 Hypothyroidism, unspecified: Secondary | ICD-10-CM | POA: Diagnosis not present

## 2022-07-29 DIAGNOSIS — D72828 Other elevated white blood cell count: Secondary | ICD-10-CM | POA: Diagnosis not present

## 2022-07-29 DIAGNOSIS — Z85850 Personal history of malignant neoplasm of thyroid: Secondary | ICD-10-CM | POA: Diagnosis not present

## 2022-07-29 DIAGNOSIS — F419 Anxiety disorder, unspecified: Secondary | ICD-10-CM | POA: Diagnosis not present

## 2022-07-29 DIAGNOSIS — E669 Obesity, unspecified: Secondary | ICD-10-CM | POA: Diagnosis not present

## 2022-07-29 DIAGNOSIS — F329 Major depressive disorder, single episode, unspecified: Secondary | ICD-10-CM | POA: Diagnosis not present

## 2022-07-29 DIAGNOSIS — M542 Cervicalgia: Secondary | ICD-10-CM | POA: Diagnosis not present

## 2022-07-29 DIAGNOSIS — R7303 Prediabetes: Secondary | ICD-10-CM | POA: Diagnosis not present

## 2022-07-29 DIAGNOSIS — F633 Trichotillomania: Secondary | ICD-10-CM | POA: Diagnosis not present

## 2022-07-29 DIAGNOSIS — I10 Essential (primary) hypertension: Secondary | ICD-10-CM | POA: Diagnosis not present

## 2022-07-29 DIAGNOSIS — E782 Mixed hyperlipidemia: Secondary | ICD-10-CM | POA: Diagnosis not present

## 2022-07-29 DIAGNOSIS — E039 Hypothyroidism, unspecified: Secondary | ICD-10-CM | POA: Diagnosis not present

## 2022-07-29 DIAGNOSIS — Z0001 Encounter for general adult medical examination with abnormal findings: Secondary | ICD-10-CM | POA: Diagnosis not present

## 2023-01-06 ENCOUNTER — Other Ambulatory Visit: Payer: Self-pay | Admitting: "Endocrinology

## 2023-01-09 ENCOUNTER — Telehealth: Payer: Self-pay | Admitting: "Endocrinology

## 2023-01-09 DIAGNOSIS — E890 Postprocedural hypothyroidism: Secondary | ICD-10-CM

## 2023-01-09 NOTE — Telephone Encounter (Signed)
Please Update Pts labs for [facility]

## 2023-01-09 NOTE — Telephone Encounter (Signed)
Labs updated and sent to [facility]. ?

## 2023-01-14 DIAGNOSIS — E890 Postprocedural hypothyroidism: Secondary | ICD-10-CM | POA: Diagnosis not present

## 2023-01-14 DIAGNOSIS — Z85850 Personal history of malignant neoplasm of thyroid: Secondary | ICD-10-CM | POA: Diagnosis not present

## 2023-01-15 LAB — TSH: TSH: 1.84 (ref 0.41–5.90)

## 2023-01-17 ENCOUNTER — Encounter: Payer: Self-pay | Admitting: "Endocrinology

## 2023-01-17 ENCOUNTER — Ambulatory Visit (INDEPENDENT_AMBULATORY_CARE_PROVIDER_SITE_OTHER): Payer: Medicare Other | Admitting: "Endocrinology

## 2023-01-17 VITALS — BP 106/78 | HR 72 | Ht 60.0 in | Wt 152.4 lb

## 2023-01-17 DIAGNOSIS — E890 Postprocedural hypothyroidism: Secondary | ICD-10-CM | POA: Diagnosis not present

## 2023-01-17 DIAGNOSIS — Z85850 Personal history of malignant neoplasm of thyroid: Secondary | ICD-10-CM | POA: Diagnosis not present

## 2023-01-17 NOTE — Progress Notes (Unsigned)
01/17/2023                Endocrinology follow-up note   Subjective:    Patient ID: Dana White, female    DOB: [birth year],    Past Medical History:  Diagnosis Date   Cancer ([place])    papillary   Hypertension    Thyroid disease    Past Surgical History:  Procedure Laterality Date   APPENDECTOMY     THYROIDECTOMY     Social History   Socioeconomic History   Marital status: Married    Spouse name: Not on file   Number of children: Not on file   Years of education: Not on file   Highest education level: Not on file  Occupational History   Not on file  Tobacco Use   Smoking status: Former   Smokeless tobacco: Never  Vaping Use   Vaping Use: Never used  Substance and Sexual Activity   Alcohol use: No   Drug use: No   Sexual activity: Not on file  Other Topics Concern   Not on file  Social History Narrative   Not on file   Social Determinants of Health   Financial Resource Strain: Not on file  Food Insecurity: Not on file  Transportation Needs: Not on file  Physical Activity: Not on file  Stress: Not on file  Social Connections: Not on file   Outpatient Encounter Medications as of 01/17/2023  Medication Sig   ALPRAZolam (XANAX) 0.5 MG tablet Take 0.5 mg by mouth 2 (two) times daily as needed.   atorvastatin (LIPITOR) 40 MG tablet Take 1 tablet by mouth daily.   cyclobenzaprine (FLEXERIL) 10 MG tablet Take 10 mg by mouth at bedtime.   levothyroxine (SYNTHROID) 125 MCG tablet TAKE (1) TABLET BY MOUTH DAILY BEFORE BREAKFAST.   lisinopril (ZESTRIL) 10 MG tablet Take 10 mg by mouth daily.   sertraline (ZOLOFT) 100 MG tablet Take 100 mg by mouth daily.   No facility-administered encounter medications on file as of 01/17/2023.   ALLERGIES: No Known Allergies VACCINATION STATUS:  There is no immunization history on file for this patient.  HPI  57 yo female with medical h/o PTC s/p near total thyroidectomy in June 2009. and I-131 therapy 151mi on  07/20/2008. She is currently on Synthroid 125 mcg p.o. every morning.  She reports compliance, no new complaints today.    Her last ( June 2015) as well as prior rTSH WBS was negative for any evidence of  thyroid cancer in October 2012 and her most recent thyroid u/s in May 2014 is negative for any residual thyroid tissue.  -She has had  surveillance thyroid/neck ultrasound on Apr 30, 2018 which showed surgically absent thyroid, benign-appearing cervical lymphadenopathy.   -Her most recent thyroid ultrasound on 10/16/2020 also showed surgically absent thyroid, unchanged benign-appearing cervical lymphadenopathy.  She has no new complaints.  She denies dysphagia, shortness of breath, nor voice change. she returns for f/u of her hypothyroidism. she has successfully quit smoking.   She remains consistent and compliant with her thyroid hormone replacement currently at 125 mcg p.o. daily before breakfast.  Review of Systems Limited as above.  Objective:    BP 106/78   Pulse 72   Ht 5' (1.524 m)   Wt 152 lb 6.4 oz (69.1 kg)   BMI 29.76 kg/m   Wt Readings from Last 3 Encounters:  01/17/23 152 lb 6.4 oz (69.1 kg)  01/17/22 158 lb 6.4 oz (71.8 kg)  11/21/20  160 lb (72.6 kg)    Physical Exam   Results for orders placed or performed in visit on 01/16/23  TSH  Result Value Ref Range   TSH 1.84 0.41 - 5.90   Complete Blood Count (Most recent): Lab Results  Component Value Date   WBC 10.6 (H) 04/22/2012   HGB 15.3 (H) 04/22/2012   HCT 44.6 04/22/2012   MCV 90.1 04/22/2012   PLT 295 04/22/2012   Chemistry (most recent): Lab Results  Component Value Date   NA 138 04/22/2012   K 4.0 04/22/2012   CL 100 04/22/2012   CO2 26 04/22/2012   BUN 8 04/22/2012   CREATININE 0.51 04/22/2012    Thyroid ultrasound on Apr 30, 2018 IMPRESSION: 1. No definitive sonographic evidence of residual or locally recurrent disease. 2. Prominent though non pathologically enlarged and benign  appearing bilateral cervical lymph nodes presumably reactive etiology   Thyroid ultrasound on 10/16/2020 IMPRESSION: 1. No evidence of residual or recurrent thyroid tissue or nodularity. 2. Surgical changes of prior total thyroidectomy. 3. Prominence but not technically enlarged (by imaging criteria) submandibular station lymph nodes.   Assessment & Plan:   1. Hypothyroidism : Postsurgical  -Her previsit thyroid function tests are consistent with appropriate replacement.  She is advised to continue Synthroid 125 mcg p.o. daily before breakfast.     - We discussed about the correct intake of her thyroid hormone, on empty stomach at fasting, with water, separated by at least 30 minutes from breakfast and other medications,  and separated by more than 4 hours from calcium, iron, multivitamins, acid reflux medications (PPIs). -Patient is made aware of the fact that thyroid hormone replacement is needed for life, dose to be adjusted by periodic monitoring of thyroid function tests.   2. Malignant neoplasm of thyroid gland  Her last thyrogen stimulated whole-body scan FROM JUNE 2015 was negative for tumor recurrence. Stimulated TG level is 0.2 ( 2.8-40.9). Her last thyroid u/s from May 2014  was negative for any residual thyroid tissue.  Her repeat neck/thyroid ultrasound, is significant for surgically absent thyroid, however benign-appearing cervical lymphadenopathy.  This study was repeated on November 2021 showing same findings.  She does not need any further investigation for tumor recurrence.     She is advised to maintain close follow-up with her PCP Dr. Nevada Crane.    I spent  21 minutes in the care of the patient today including review of labs from Thyroid Function, CMP, and other relevant labs ; imaging/biopsy records (current and previous including abstractions from other facilities); face-to-face time discussing  her lab results and symptoms, medications doses, her options of short  and long term treatment based on the latest standards of care / guidelines;   and documenting the encounter.  Shailee D Schwartzkopf  participated in the discussions, expressed understanding, and voiced agreement with the above plans.  All questions were answered to her satisfaction. she is encouraged to contact clinic should she have any questions or concerns prior to her return visit.   Follow up plan: Return in about 1 year (around 01/18/2024) for F/U with Pre-visit Labs.  Glade Lloyd, MD Phone: [phone]  Fax: [phone]  -  This note was partially dictated with voice recognition software. Similar sounding words can be transcribed inadequately or may not  be corrected upon review.  01/17/2023, 12:22 PM

## 2023-01-29 DIAGNOSIS — E0590 Thyrotoxicosis, unspecified without thyrotoxic crisis or storm: Secondary | ICD-10-CM | POA: Diagnosis not present

## 2023-01-29 DIAGNOSIS — I10 Essential (primary) hypertension: Secondary | ICD-10-CM | POA: Diagnosis not present

## 2023-01-29 DIAGNOSIS — R7303 Prediabetes: Secondary | ICD-10-CM | POA: Diagnosis not present

## 2023-01-29 DIAGNOSIS — E559 Vitamin D deficiency, unspecified: Secondary | ICD-10-CM | POA: Diagnosis not present

## 2023-01-29 DIAGNOSIS — E782 Mixed hyperlipidemia: Secondary | ICD-10-CM | POA: Diagnosis not present

## 2023-02-04 DIAGNOSIS — Z85850 Personal history of malignant neoplasm of thyroid: Secondary | ICD-10-CM | POA: Diagnosis not present

## 2023-02-04 DIAGNOSIS — Z0001 Encounter for general adult medical examination with abnormal findings: Secondary | ICD-10-CM | POA: Diagnosis not present

## 2023-02-04 DIAGNOSIS — F329 Major depressive disorder, single episode, unspecified: Secondary | ICD-10-CM | POA: Diagnosis not present

## 2023-02-04 DIAGNOSIS — F419 Anxiety disorder, unspecified: Secondary | ICD-10-CM | POA: Diagnosis not present

## 2023-02-04 DIAGNOSIS — M545 Low back pain, unspecified: Secondary | ICD-10-CM | POA: Insufficient documentation

## 2023-02-04 DIAGNOSIS — F633 Trichotillomania: Secondary | ICD-10-CM | POA: Diagnosis not present

## 2023-02-04 DIAGNOSIS — E663 Overweight: Secondary | ICD-10-CM | POA: Diagnosis not present

## 2023-02-04 DIAGNOSIS — E782 Mixed hyperlipidemia: Secondary | ICD-10-CM | POA: Diagnosis not present

## 2023-02-04 DIAGNOSIS — E039 Hypothyroidism, unspecified: Secondary | ICD-10-CM | POA: Diagnosis not present

## 2023-02-04 DIAGNOSIS — R3129 Other microscopic hematuria: Secondary | ICD-10-CM | POA: Insufficient documentation

## 2023-02-04 DIAGNOSIS — D72828 Other elevated white blood cell count: Secondary | ICD-10-CM | POA: Diagnosis not present

## 2023-02-04 DIAGNOSIS — M542 Cervicalgia: Secondary | ICD-10-CM | POA: Diagnosis not present

## 2023-02-04 DIAGNOSIS — I10 Essential (primary) hypertension: Secondary | ICD-10-CM | POA: Diagnosis not present

## 2023-02-04 DIAGNOSIS — R350 Frequency of micturition: Secondary | ICD-10-CM | POA: Diagnosis not present

## 2023-04-06 ENCOUNTER — Other Ambulatory Visit: Payer: Self-pay | Admitting: "Endocrinology

## 2023-06-29 ENCOUNTER — Other Ambulatory Visit: Payer: Self-pay | Admitting: "Endocrinology

## 2023-07-31 DIAGNOSIS — E0590 Thyrotoxicosis, unspecified without thyrotoxic crisis or storm: Secondary | ICD-10-CM | POA: Diagnosis not present

## 2023-07-31 DIAGNOSIS — R7303 Prediabetes: Secondary | ICD-10-CM | POA: Diagnosis not present

## 2023-07-31 DIAGNOSIS — E559 Vitamin D deficiency, unspecified: Secondary | ICD-10-CM | POA: Diagnosis not present

## 2023-07-31 DIAGNOSIS — I10 Essential (primary) hypertension: Secondary | ICD-10-CM | POA: Diagnosis not present

## 2023-08-05 DIAGNOSIS — R350 Frequency of micturition: Secondary | ICD-10-CM | POA: Diagnosis not present

## 2023-08-05 DIAGNOSIS — M545 Low back pain, unspecified: Secondary | ICD-10-CM | POA: Diagnosis not present

## 2023-08-05 DIAGNOSIS — F17200 Nicotine dependence, unspecified, uncomplicated: Secondary | ICD-10-CM | POA: Insufficient documentation

## 2023-08-05 DIAGNOSIS — R3129 Other microscopic hematuria: Secondary | ICD-10-CM | POA: Diagnosis not present

## 2023-08-05 DIAGNOSIS — D72829 Elevated white blood cell count, unspecified: Secondary | ICD-10-CM | POA: Diagnosis not present

## 2023-08-05 DIAGNOSIS — E782 Mixed hyperlipidemia: Secondary | ICD-10-CM | POA: Diagnosis not present

## 2023-08-05 DIAGNOSIS — N3010 Interstitial cystitis (chronic) without hematuria: Secondary | ICD-10-CM | POA: Insufficient documentation

## 2023-08-05 DIAGNOSIS — F329 Major depressive disorder, single episode, unspecified: Secondary | ICD-10-CM | POA: Diagnosis not present

## 2023-08-05 DIAGNOSIS — F633 Trichotillomania: Secondary | ICD-10-CM | POA: Diagnosis not present

## 2023-08-05 DIAGNOSIS — I10 Essential (primary) hypertension: Secondary | ICD-10-CM | POA: Diagnosis not present

## 2023-08-05 DIAGNOSIS — E669 Obesity, unspecified: Secondary | ICD-10-CM | POA: Diagnosis not present

## 2023-08-05 DIAGNOSIS — M542 Cervicalgia: Secondary | ICD-10-CM | POA: Diagnosis not present

## 2023-08-05 DIAGNOSIS — E039 Hypothyroidism, unspecified: Secondary | ICD-10-CM | POA: Diagnosis not present

## 2023-08-05 DIAGNOSIS — E559 Vitamin D deficiency, unspecified: Secondary | ICD-10-CM | POA: Diagnosis not present

## 2023-08-05 DIAGNOSIS — Z85850 Personal history of malignant neoplasm of thyroid: Secondary | ICD-10-CM | POA: Diagnosis not present

## 2023-09-04 NOTE — Progress Notes (Deleted)
Name: Dana White DOB: 1966-04-02 MRN: [id number]  History of Present Illness: Dana White is a 57 y.o. female who presents today as a new patient at [facility] [place]. - GU History: 1. Interstitial cystitis.   ***no relevant records as of 09/04/2023  She reports chief complaint of ***microscopic hematuria. - ***: UA showed *** RBC/hpf  Today: She {Actions; denies-reports:120008} dysuria. She urinates*** times per day. She {Actions; denies-reports:120008} urgency. She {Actions; denies-reports:120008} the need to strain to void. She {Actions; denies-reports:120008} sensations of incomplete emptying. She {Actions; denies-reports:120008} abdominal pain. She {Actions; denies-reports:120008} flank pain. She {Actions; denies-reports:120008} fevers.  She {Actions; denies-reports:120008} prior history of gross hematuria.  She {Actions; denies-reports:120008} history of kidney stones.  She {Actions; denies-reports:120008} history of pyelonephritis.  She {Actions; denies-reports:120008} history of recent or recurrent UTI. She {Actions; denies-reports:120008} history of GU malignancy or pelvic radiation.  She {Actions; denies-reports:120008} history of autoimmune disease. She {Actions; denies-reports:120008} history of smoking (quit ***; has smoked*** ppd x*** years). She {Actions; denies-reports:120008} known occupational risks. She {Actions; denies-reports:120008} recent vigorous exercise which they think may be contributory to hematuria. She {Actions; denies-reports:120008} any recent trauma or prolonged pressure to the perineal area. She {Actions; denies-reports:120008} recent illness. She {Actions; denies-reports:120008} taking anticoagulants (***).  Fall Screening: Do you usually have a device to assist in your mobility? {yes/no:20286} ***cane / ***walker / ***wheelchair  Medications: Current Outpatient Medications  Medication Sig Dispense Refill   ALPRAZolam  (XANAX) 0.5 MG tablet Take 0.5 mg by mouth 2 (two) times daily as needed.     atorvastatin (LIPITOR) 40 MG tablet Take 1 tablet by mouth daily.     cyclobenzaprine (FLEXERIL) 10 MG tablet Take 10 mg by mouth at bedtime.     levothyroxine (SYNTHROID) 125 MCG tablet TAKE ONE TABLET BY MOUTH ONCE DAILY BEFORE BREAKFAST 30 tablet 2   lisinopril (ZESTRIL) 10 MG tablet Take 10 mg by mouth daily.     sertraline (ZOLOFT) 100 MG tablet Take 100 mg by mouth daily.     No current facility-administered medications for this visit.    Allergies: No Known Allergies  Past Medical History:  Diagnosis Date   Cancer (HCC)    papillary   Hypertension    Thyroid disease    Past Surgical History:  Procedure Laterality Date   APPENDECTOMY     THYROIDECTOMY     Family History  Problem Relation Age of Onset   Hypertension Mother    Hyperlipidemia Mother    Social History   Socioeconomic History   Marital status: Married    Spouse name: Not on file   Number of children: Not on file   Years of education: Not on file   Highest education level: Not on file  Occupational History   Not on file  Tobacco Use   Smoking status: Former   Smokeless tobacco: Never  Vaping Use   Vaping status: Never Used  Substance and Sexual Activity   Alcohol use: No   Drug use: No   Sexual activity: Not on file  Other Topics Concern   Not on file  Social History Narrative   Not on file   Social Determinants of Health   Financial Resource Strain: Not on file  Food Insecurity: Not on file  Transportation Needs: Not on file  Physical Activity: Not on file  Stress: Not on file  Social Connections: Not on file  Intimate Partner Violence: Not on file    SUBJECTIVE  Review of Systems Constitutional: Patient ***  denies any unintentional weight loss or change in strength lntegumentary: Patient ***denies any rashes or pruritus Eyes: Patient denies ***dry eyes ENT: Patient ***denies dry mouth Cardiovascular:  Patient ***denies chest pain or syncope Respiratory: Patient ***denies shortness of breath Gastrointestinal: Patient ***denies nausea, vomiting, constipation, or diarrhea Musculoskeletal: Patient ***denies muscle cramps or weakness Neurologic: Patient ***denies convulsions or seizures Psychiatric: Patient ***denies memory problems Allergic/Immunologic: Patient ***denies recent allergic reaction(s) Hematologic/Lymphatic: Patient denies bleeding tendencies Endocrine: Patient ***denies heat/cold intolerance  GU: As per HPI.  OBJECTIVE There were no vitals filed for this visit. There is no height or weight on file to calculate BMI.  Physical Examination Constitutional: ***No obvious distress; patient is ***non-toxic appearing  Cardiovascular: ***No visible lower extremity edema.  Respiratory: The patient does ***not have audible wheezing/stridor; respirations do ***not appear labored  Gastrointestinal: Abdomen ***non-distended Musculoskeletal: ***Normal ROM of UEs  Skin: ***No obvious rashes/open sores  Neurologic: CN 2-12 grossly ***intact Psychiatric: Answered questions ***appropriately with ***normal affect  Hematologic/Lymphatic/Immunologic: ***No obvious bruises or sites of spontaneous bleeding  UA: ***negative / *** WBC/hpf, *** RBC/hpf, bacteria (***) PVR: *** ml  ASSESSMENT No diagnosis found.  For asymptomatic microscopic hematuria we discussed possible etiologies including but not limited to: vigorous exercise, sexual activity, stone, trauma, blood thinner use, urinary tract infection, urethral irritation secondary to ***vaginal atrophy, chronic kidney disease, glomerulonephropathy, ***BPH, ***radiation cystitis, malignancy. ***We discussed pt's smoking as a risk factor for GU cancer and encouraged ***continued smoking cessation.***  We reviewed the AUA 2020 [facility] guideline and risk stratification for this patient. Based on individual risk factors, pt was advised that the  recommended workup includes ***repeat UA in 6 months ***RUS ***CT urogram ***cystoscopy. Pt decided to pursue this work-up and follow-up afterward to discuss the results and formulate a treatment plan based on the findings. All questions were answered.  *** AUA 2020 [facility] guideline     PLAN Advised the following: ***RUS ***CT ordered. 2. ***No follow-ups on file.  No orders of the defined types were placed in this encounter.   It has been explained that the patient is to follow regularly with their PCP in addition to all other providers involved in their care and to follow instructions provided by these respective offices. Patient advised to contact urology clinic if any urologic-pertaining questions, concerns, new symptoms or problems arise in the interim period.  There are no Patient Instructions on file for this visit.  Electronically signed by:  Donnita Falls, MSN, FNP-C, CUNP 09/04/2023 6:12 PM

## 2023-09-10 ENCOUNTER — Ambulatory Visit: Payer: Medicare Other | Admitting: Urology

## 2023-09-10 DIAGNOSIS — R3129 Other microscopic hematuria: Secondary | ICD-10-CM

## 2023-09-10 DIAGNOSIS — N3010 Interstitial cystitis (chronic) without hematuria: Secondary | ICD-10-CM

## 2023-09-23 NOTE — Progress Notes (Signed)
Name: Dana White DOB: [birth year] MRN: [id number]  History of Present Illness: Dana White is a 57 y.o. female who presents today as a new patient at [facility] [place]. - GU History: 1. Interstitial cystitis.  2. Distant history of pyelonephritis during pregnancy 40+ years ago.  She reports chief complaint of microscopic hematuria. Per scanned records from PCP (Dr. Margo Aye): - 01/29/2023: Normal renal function (GFR 102; creatinine 0.64). - 02/04/2023: UA with small (1+) blood; otherwise unremarkable. Negative urine culture.  - 08/01/2023: Normal renal function (GFR 87; creatinine 0.78).  Today: She denies bladder pain, dysuria, gross hematuria, hesitancy, straining to void, or sensations of incomplete emptying. She reports urinary urgency and frequency, which she attributes to her significant caffeine intake [facility]).  Reports occasional stress urinary incontinence.  She reports left flank pain which radiates around to her LLQ for >1 year which is intermittent and exacerbated by physical activity or direct pressure.  She denies prior history of gross hematuria.  She denies history of kidney stones.  She denies history of recent or recurrent UTI. She denies history of GU malignancy or pelvic radiation.  She reports history of smoking; she  She denies taking anticoagulants.   Fall Screening: Do you usually have a device to assist in your mobility? No   Medications: Current Outpatient Medications  Medication Sig Dispense Refill   ALPRAZolam (XANAX) 0.5 MG tablet Take 0.5 mg by mouth 2 (two) times daily as needed.     atorvastatin (LIPITOR) 40 MG tablet Take 1 tablet by mouth daily.     cyclobenzaprine (FLEXERIL) 10 MG tablet Take 10 mg by mouth at bedtime.     levothyroxine (SYNTHROID) 125 MCG tablet TAKE ONE TABLET BY MOUTH ONCE DAILY BEFORE BREAKFAST 30 tablet 2   lisinopril (ZESTRIL) 10 MG tablet Take 10 mg by mouth daily.     sertraline (ZOLOFT) 100 MG  tablet Take 100 mg by mouth daily.     No current facility-administered medications for this visit.    Allergies: No Known Allergies  Past Medical History:  Diagnosis Date   Cancer (HCC)    papillary   Hypertension    Thyroid disease    Past Surgical History:  Procedure Laterality Date   APPENDECTOMY     THYROIDECTOMY     Family History  Problem Relation Age of Onset   Hypertension Mother    Hyperlipidemia Mother    Social History   Socioeconomic History   Marital status: Married    Spouse name: Not on file   Number of children: Not on file   Years of education: Not on file   Highest education level: Not on file  Occupational History   Not on file  Tobacco Use   Smoking status: Some Days    Types: Cigarettes   Smokeless tobacco: Never  Vaping Use   Vaping status: Never Used  Substance and Sexual Activity   Alcohol use: No   Drug use: No   Sexual activity: Not Currently  Other Topics Concern   Not on file  Social History Narrative   Not on file   Social Determinants of Health   Financial Resource Strain: Not on file  Food Insecurity: Not on file  Transportation Needs: Not on file  Physical Activity: Not on file  Stress: Not on file  Social Connections: Not on file  Intimate Partner Violence: Not on file    SUBJECTIVE  Review of Systems Constitutional: Patient denies any unintentional weight loss or  change in strength lntegumentary: Patient denies any rashes or pruritus Cardiovascular: Patient denies chest pain or syncope Respiratory: Patient denies shortness of breath Gastrointestinal: Patient denies nausea, vomiting, constipation, or diarrhea Musculoskeletal: Patient denies muscle cramps or weakness Neurologic: Patient denies convulsions or seizures Psychiatric: Patient denies memory problems Allergic/Immunologic: Patient denies recent allergic reaction(s) Hematologic/Lymphatic: Patient denies bleeding tendencies Endocrine: Patient denies  heat/cold intolerance  GU: As per HPI.  OBJECTIVE Vitals:   09/30/23 1102  BP: 129/89  Pulse: 77  Temp: 98 F (36.7 C)   Body mass index is 29.76 kg/m.  Physical Examination Constitutional: No obvious distress; patient is non-toxic appearing  Cardiovascular: No visible lower extremity edema.  Respiratory: The patient does not have audible wheezing/stridor; respirations do not appear labored  Gastrointestinal: Abdomen non-distended Musculoskeletal: Normal ROM of UEs  Skin: No obvious rashes/open sores  Neurologic: CN 2-12 grossly intact Psychiatric: Answered questions appropriately with normal affect  Hematologic/Lymphatic/Immunologic: No obvious bruises or sites of spontaneous bleeding  UA: 3-10 RBC/hpf with no evidence of UTI  PVR: 0 ml  ASSESSMENT Chronic interstitial cystitis - Plan: Urinalysis, Routine w reflex microscopic, BLADDER SCAN AMB NON-IMAGING  Microscopic hematuria  Smoker  Chronic left flank pain  Stress incontinence of urine  Caffeine use  For asymptomatic microscopic hematuria we discussed possible etiologies including but not limited to: vigorous exercise, sexual activity, stone, trauma, blood thinner use, urinary tract infection, urethral irritation secondary to vaginal atrophy, chronic kidney disease, glomerulonephropathy, malignancy. We discussed pt's smoking as a risk factor for GU cancer and encouraged smoking cessation.  We reviewed the AUA 2020 [facility] guideline and risk stratification for this patient. Based on individual risk factors, pt was advised that the recommended workup includes CT urogram and cystoscopy. Pt decided to pursue this work-up and follow-up afterward to discuss the results and formulate a treatment plan based on the findings. All questions were answered.   PLAN Advised the following: CT ordered. 2. Return for 1st available cystoscopy with any urology MD.  Orders Placed This Encounter  Procedures   Urinalysis, Routine w  reflex microscopic   BLADDER SCAN AMB NON-IMAGING    It has been explained that the patient is to follow regularly with their PCP in addition to all other providers involved in their care and to follow instructions provided by these respective offices. Patient advised to contact urology clinic if any urologic-pertaining questions, concerns, new symptoms or problems arise in the interim period.  There are no Patient Instructions on file for this visit.  Electronically signed by:  Donnita Falls, MSN, FNP-C, CUNP 09/30/2023 12:54 PM

## 2023-09-28 ENCOUNTER — Other Ambulatory Visit: Payer: Self-pay | Admitting: "Endocrinology

## 2023-09-30 ENCOUNTER — Encounter: Payer: Self-pay | Admitting: Urology

## 2023-09-30 ENCOUNTER — Ambulatory Visit (INDEPENDENT_AMBULATORY_CARE_PROVIDER_SITE_OTHER): Payer: Medicare Other | Admitting: Urology

## 2023-09-30 VITALS — BP 129/89 | HR 77 | Temp 98.0°F | Ht 60.0 in | Wt 152.4 lb

## 2023-09-30 DIAGNOSIS — N393 Stress incontinence (female) (male): Secondary | ICD-10-CM | POA: Diagnosis not present

## 2023-09-30 DIAGNOSIS — R109 Unspecified abdominal pain: Secondary | ICD-10-CM | POA: Diagnosis not present

## 2023-09-30 DIAGNOSIS — G8929 Other chronic pain: Secondary | ICD-10-CM | POA: Diagnosis not present

## 2023-09-30 DIAGNOSIS — Z789 Other specified health status: Secondary | ICD-10-CM | POA: Diagnosis not present

## 2023-09-30 DIAGNOSIS — F17200 Nicotine dependence, unspecified, uncomplicated: Secondary | ICD-10-CM

## 2023-09-30 DIAGNOSIS — N3010 Interstitial cystitis (chronic) without hematuria: Secondary | ICD-10-CM | POA: Diagnosis not present

## 2023-09-30 DIAGNOSIS — R3129 Other microscopic hematuria: Secondary | ICD-10-CM | POA: Diagnosis not present

## 2023-09-30 LAB — URINALYSIS, ROUTINE W REFLEX MICROSCOPIC
Bilirubin, UA: NEGATIVE
Glucose, UA: NEGATIVE
Ketones, UA: NEGATIVE
Nitrite, UA: NEGATIVE
Protein,UA: NEGATIVE
Specific Gravity, UA: 1.01 (ref 1.005–1.030)
Urobilinogen, Ur: 1 mg/dL (ref 0.2–1.0)
pH, UA: 7.5 (ref 5.0–7.5)

## 2023-09-30 LAB — MICROSCOPIC EXAMINATION: Bacteria, UA: NONE SEEN

## 2023-09-30 LAB — BLADDER SCAN AMB NON-IMAGING: Scan Result: 0

## 2023-09-30 NOTE — Progress Notes (Signed)
post void residual = 0 ml

## 2023-10-13 DIAGNOSIS — Z23 Encounter for immunization: Secondary | ICD-10-CM | POA: Diagnosis not present

## 2023-10-16 NOTE — Addendum Note (Signed)
Addended by: Gustavus Messing on: 10/16/2023 12:44 PM   Modules accepted: Orders

## 2023-10-28 ENCOUNTER — Other Ambulatory Visit: Payer: Medicare Other | Admitting: Urology

## 2023-10-29 ENCOUNTER — Ambulatory Visit (HOSPITAL_COMMUNITY)
Admission: RE | Admit: 2023-10-29 | Discharge: 2023-10-29 | Disposition: A | Discharge status: Home / Self Care | Payer: Medicare Other | Source: Ambulatory Visit | Attending: Urology | Admitting: Urology

## 2023-10-29 DIAGNOSIS — D7389 Other diseases of spleen: Secondary | ICD-10-CM | POA: Diagnosis not present

## 2023-10-29 DIAGNOSIS — R3129 Other microscopic hematuria: Secondary | ICD-10-CM | POA: Insufficient documentation

## 2023-10-29 DIAGNOSIS — K429 Umbilical hernia without obstruction or gangrene: Secondary | ICD-10-CM | POA: Diagnosis not present

## 2023-10-29 DIAGNOSIS — K449 Diaphragmatic hernia without obstruction or gangrene: Secondary | ICD-10-CM | POA: Diagnosis not present

## 2023-10-29 LAB — POCT I-STAT CREATININE: Creatinine, Ser: 0.7 mg/dL (ref 0.44–1.00)

## 2023-10-29 MED ORDER — IOHEXOL 300 MG/ML  SOLN
125.0000 mL | Freq: Once | INTRAMUSCULAR | Status: AC | PRN
  Administered 2023-10-29: 125 mL via INTRAVENOUS

## 2023-11-12 NOTE — Progress Notes (Signed)
Please notify patient:  - CT showed no acute GU findings; nothing to explain hematuria. No GU stones, masses, or hydronephrosis; bladder unremarkable. Advised to follow up for cystoscopy with Dr. Annabell Howells on 12/16/2023 as planned for further evaluation.  - There was an incidental finding of "an indeterminate 1.3 x 1.7 cm splenic lesion" for which the radiologist advised "Follow-up examination with contrast-enhanced MRI is recommended in 6 months." Patient needs to discuss that with her PCP, who will need to arrange that follow up.

## 2023-12-16 ENCOUNTER — Other Ambulatory Visit: Payer: Medicare Other | Admitting: Urology

## 2023-12-17 HISTORY — PX: CYSTOSCOPY: SUR368

## 2024-01-07 ENCOUNTER — Other Ambulatory Visit: Payer: Self-pay | Admitting: "Endocrinology

## 2024-01-11 NOTE — Progress Notes (Signed)
Chief Complaint: No chief complaint on file.   History of Present Illness:  This woman presents for cystoscopy.  She has a history of microscopic hematuria.  She underwent CT hematuria protocol on 11/12/2023 with findings being:  1. No nephroureterolithiasis or obstructive uropathy. No focal renal or urinary bladder mass. No stricture, abnormal dilation or filling defects in the bilateral renal collecting systems and ureter. 2. There is an indeterminate 1.3 x 1.7 cm splenic lesion, as described above. Follow-up examination with contrast-enhanced MRI is recommended in 6 months. 3. Multiple other nonacute observations, as described above.   Past Medical History:  Past Medical History:  Diagnosis Date   Cancer (HCC)    papillary   Hypertension    Thyroid disease     Past Surgical History:  Past Surgical History:  Procedure Laterality Date   APPENDECTOMY     THYROIDECTOMY      Allergies:  No Known Allergies  Family History:  Family History  Problem Relation Age of Onset   Hypertension Mother    Hyperlipidemia Mother     Social History:  Social History   Tobacco Use   Smoking status: Some Days    Types: Cigarettes   Smokeless tobacco: Never  Vaping Use   Vaping status: Never Used  Substance Use Topics   Alcohol use: No   Drug use: No    Review of symptoms:  Constitutional:  Negative for unexplained weight loss, night sweats, fever, chills ENT:  Negative for nose bleeds, sinus pain, painful swallowing CV:  Negative for chest pain, shortness of breath, exercise intolerance, palpitations, loss of consciousness Resp:  Negative for cough, wheezing, shortness of breath GI:  Negative for nausea, vomiting, diarrhea, bloody stools GU:  Positives noted in HPI; otherwise negative for gross hematuria, dysuria, urinary incontinence Neuro:  Negative for seizures, poor balance, limb weakness, slurred speech Psych:  Negative for lack of energy, depression,  anxiety Endocrine:  Negative for polydipsia, polyuria, symptoms of hypoglycemia (dizziness, hunger, sweating) Hematologic:  Negative for anemia, purpura, petechia, prolonged or excessive bleeding, use of anticoagulants  Allergic:  Negative for difficulty breathing or choking as a result of exposure to anything; no shellfish allergy; no allergic response (rash/itch) to materials, foods  Physical exam: There were no vitals taken for this visit. GENERAL APPEARANCE:  Well appearing, well developed, well nourished, NAD HEENT: Atraumatic, Normocephalic, oropharynx clear. NECK: Supple without lymphadenopathy or thyromegaly. LUNGS: Clear to auscultation bilaterally. HEART: Regular Rate and Rhythm without murmurs, gallops, or rubs. NEUROLOGIC:  Alert and oriented x 3, normal gait, CN II-XII grossly intact.  MENTAL STATUS:  Appropriate. BACK:  Non-tender to palpation.  No CVAT SKIN:  Warm, dry and intact.    Results:  I have reviewed prior patient's records  I have reviewed urinalysis  I have reviewed prior urine cultures  I reviewed prior imaging studies--CT images reviewed  Indication: Microscopic hematuria  After informed consent and discussion of the procedure and its risks, Zada Finders was positioned and prepped in the standard fashion.  Cystoscopy was performed with a flexible cystoscope.   Findings:  Urethra: Normal Ureteral orifices: Normally positioned bilaterally.  Normal configuration. Bladder: No urothelial lesions noted.  No foreign bodies.  Assessment:  -Microscopic hematuria with negative CT/cystoscopy  -Small splenic lesion.  Radiology recommends MRI in 4 to 6 months   Plan: -I reassured her about her urologic evaluation.  I do not think she needs further follow-up.  -Will send Dr. Margo Aye CT results this note.  Would recommend scheduling MRI through his office

## 2024-01-12 ENCOUNTER — Telehealth: Payer: Self-pay | Admitting: "Endocrinology

## 2024-01-12 ENCOUNTER — Other Ambulatory Visit: Payer: Self-pay | Admitting: *Deleted

## 2024-01-12 DIAGNOSIS — Z85850 Personal history of malignant neoplasm of thyroid: Secondary | ICD-10-CM

## 2024-01-12 DIAGNOSIS — E890 Postprocedural hypothyroidism: Secondary | ICD-10-CM

## 2024-01-12 NOTE — Telephone Encounter (Signed)
Labs updated

## 2024-01-12 NOTE — Telephone Encounter (Signed)
Pt needs labs updated

## 2024-01-13 ENCOUNTER — Ambulatory Visit (INDEPENDENT_AMBULATORY_CARE_PROVIDER_SITE_OTHER): Payer: Medicare Other | Admitting: Urology

## 2024-01-13 VITALS — BP 121/76 | HR 105

## 2024-01-13 DIAGNOSIS — Z85850 Personal history of malignant neoplasm of thyroid: Secondary | ICD-10-CM | POA: Diagnosis not present

## 2024-01-13 DIAGNOSIS — D7389 Other diseases of spleen: Secondary | ICD-10-CM

## 2024-01-13 DIAGNOSIS — R3129 Other microscopic hematuria: Secondary | ICD-10-CM | POA: Diagnosis not present

## 2024-01-13 DIAGNOSIS — E890 Postprocedural hypothyroidism: Secondary | ICD-10-CM | POA: Diagnosis not present

## 2024-01-13 DIAGNOSIS — N3010 Interstitial cystitis (chronic) without hematuria: Secondary | ICD-10-CM | POA: Diagnosis not present

## 2024-01-13 LAB — MICROSCOPIC EXAMINATION
Bacteria, UA: NONE SEEN
WBC, UA: NONE SEEN /[HPF] (ref 0–5)

## 2024-01-13 LAB — URINALYSIS, ROUTINE W REFLEX MICROSCOPIC
Bilirubin, UA: NEGATIVE
Glucose, UA: NEGATIVE
Ketones, UA: NEGATIVE
Leukocytes,UA: NEGATIVE
Nitrite, UA: NEGATIVE
Protein,UA: NEGATIVE
Specific Gravity, UA: <1.005 — ABNORMAL LOW (ref 1.005–1.030)
Urobilinogen, Ur: 0.2 mg/dL (ref 0.2–1.0)
pH, UA: 6.5 (ref 5.0–7.5)

## 2024-01-13 LAB — TSH
Free T4: 1.65 ng/dL
TSH: 1.36 (ref 0.41–5.90)

## 2024-01-13 MED ORDER — CIPROFLOXACIN HCL 500 MG PO TABS
500.0000 mg | ORAL_TABLET | Freq: Once | ORAL | Status: AC
  Administered 2024-01-13: 500 mg via ORAL

## 2024-01-19 ENCOUNTER — Encounter: Payer: Self-pay | Admitting: "Endocrinology

## 2024-01-19 ENCOUNTER — Ambulatory Visit (INDEPENDENT_AMBULATORY_CARE_PROVIDER_SITE_OTHER): Payer: Medicare Other | Admitting: "Endocrinology

## 2024-01-19 VITALS — BP 114/78 | HR 76 | Ht 60.0 in | Wt 158.8 lb

## 2024-01-19 DIAGNOSIS — Z85850 Personal history of malignant neoplasm of thyroid: Secondary | ICD-10-CM

## 2024-01-19 DIAGNOSIS — E890 Postprocedural hypothyroidism: Secondary | ICD-10-CM | POA: Diagnosis not present

## 2024-01-19 MED ORDER — LEVOTHYROXINE SODIUM 125 MCG PO TABS: ORAL_TABLET | ORAL | 3 refills | Status: DC

## 2024-01-19 NOTE — Progress Notes (Signed)
01/19/2024                Endocrinology follow-up note   Subjective:    Patient ID: Dana White, female    DOB: Feb 23, 1966,    Past Medical History:  Diagnosis Date   Cancer (HCC)    papillary   Hypertension    Thyroid disease    Past Surgical History:  Procedure Laterality Date   APPENDECTOMY     CYSTOSCOPY  12/2023   THYROIDECTOMY     Social History   Socioeconomic History   Marital status: Married    Spouse name: Not on file   Number of children: Not on file   Years of education: Not on file   Highest education level: Not on file  Occupational History   Not on file  Tobacco Use   Smoking status: Some Days    Types: Cigarettes   Smokeless tobacco: Never  Vaping Use   Vaping status: Never Used  Substance and Sexual Activity   Alcohol use: No   Drug use: No   Sexual activity: Not Currently  Other Topics Concern   Not on file  Social History Narrative   Not on file   Social Drivers of Health   Financial Resource Strain: Not on file  Food Insecurity: Not on file  Transportation Needs: Not on file  Physical Activity: Not on file  Stress: Not on file  Social Connections: Not on file   Outpatient Encounter Medications as of 01/19/2024  Medication Sig   ALPRAZolam (XANAX) 0.5 MG tablet Take 0.5 mg by mouth 2 (two) times daily as needed.   atorvastatin (LIPITOR) 40 MG tablet Take 1 tablet by mouth daily.   cyclobenzaprine (FLEXERIL) 10 MG tablet Take 10 mg by mouth at bedtime.   levothyroxine (SYNTHROID) 125 MCG tablet TAKE ONE TABLET BY MOUTH ONCE DAILY BEFORE BREAKFAST   lisinopril (ZESTRIL) 10 MG tablet Take 10 mg by mouth daily.   sertraline (ZOLOFT) 100 MG tablet Take 100 mg by mouth daily.   [DISCONTINUED] levothyroxine (SYNTHROID) 125 MCG tablet TAKE ONE TABLET BY MOUTH ONCE DAILY BEFORE BREAKFAST   No facility-administered encounter medications on file as of 01/19/2024.   ALLERGIES: No Known Allergies VACCINATION STATUS:  There is no  immunization history on file for this patient.  HPI  58 yo female with medical h/o PTC s/p near total thyroidectomy in June 2009. and I-131 therapy on 07/20/2008. She is currently on Synthroid 125 mcg p.o. every morning.  Her last ( June 2015) as well as prior rTSH [facility] was negative for any evidence of  thyroid cancer in October 2012 and her most recent thyroid u/s in May 2014 is negative for any residual thyroid tissue.  -She has had  surveillance thyroid/neck ultrasound on Apr 30, 2018 which showed surgically absent thyroid, benign-appearing cervical lymphadenopathy.   -Her most recent thyroid ultrasound on 10/16/2020 also showed surgically absent thyroid, unchanged benign-appearing cervical lymphadenopathy.  She has no new complaints.  She denies dysphagia, shortness of breath, nor voice change. she returns for f/u of her hypothyroidism. she has successfully quit smoking.   She remains consistent and compliant with her thyroid hormone replacement currently at 125 mcg p.o. daily before breakfast.  She has no new complaints today.  Review of Systems Limited as above.  Objective:    BP 114/78   Pulse 76   Ht 5' (1.524 m)   Wt 158 lb 12.8 oz (72 kg)   BMI 31.01 kg/m  Wt Readings from Last 3 Encounters:  01/19/24 158 lb 12.8 oz (72 kg)  09/30/23 152 lb 6.4 oz (69.1 kg)  01/17/23 152 lb 6.4 oz (69.1 kg)    Physical Exam   Results for orders placed or performed in visit on 01/16/24  TSH   Collection Time: 01/13/24 12:00 AM  Result Value Ref Range   TSH 1.36 0.41 - 5.90   Complete Blood Count (Most recent): Lab Results  Component Value Date   WBC 10.6 (H) 04/22/2012   HGB 15.3 (H) 04/22/2012   HCT 44.6 04/22/2012   MCV 90.1 04/22/2012   PLT 295 04/22/2012   Chemistry (most recent): Lab Results  Component Value Date   NA 138 04/22/2012   K 4.0 04/22/2012   CL 100 04/22/2012   CO2 26 04/22/2012   BUN 8 04/22/2012   CREATININE 0.70 10/29/2023    Thyroid ultrasound  on Apr 30, 2018 IMPRESSION: 1. No definitive sonographic evidence of residual or locally recurrent disease. 2. Prominent though non pathologically enlarged and benign appearing bilateral cervical lymph nodes presumably reactive etiology   Thyroid ultrasound on 10/16/2020 IMPRESSION: 1. No evidence of residual or recurrent thyroid tissue or nodularity. 2. Surgical changes of prior total thyroidectomy. 3. Prominence but not technically enlarged (by imaging criteria) submandibular station lymph nodes.   Assessment & Plan:   1. Hypothyroidism : Postsurgical  -Her previsit thyroid function tests are consistent with appropriate replacement.  She is advised to continue Synthroid 125 mcg p.o. daily before breakfast.    - We discussed about the correct intake of her thyroid hormone, on empty stomach at fasting, with water, separated by at least 30 minutes from breakfast and other medications,  and separated by more than 4 hours from calcium, iron, multivitamins, acid reflux medications (PPIs). -Patient is made aware of the fact that thyroid hormone replacement is needed for life, dose to be adjusted by periodic monitoring of thyroid function tests.  2. Malignant neoplasm of thyroid gland  Her last thyrogen stimulated whole-body scan FROM JUNE 2015 was negative for tumor recurrence. Stimulated TG level is 0.2 ( 2.8-40.9). Her last thyroid u/s from May 2014  was negative for any residual thyroid tissue.  15 years since her thyroid surgery, last thyroid/neck ultrasound from November 20 21st showed surgically absent thyroid,  however benign-appearing cervical lymphadenopathy.  She has no new findings today on physical exam.    She does not need any further investigation for tumor recurrence.    She is advised to maintain close follow-up with her PCP Dr. Margo Aye.   I spent  22  minutes in the care of the patient today including review of labs from Thyroid Function, CMP, and other relevant labs ;  imaging/biopsy records (current and previous including abstractions from other facilities); face-to-face time discussing  her lab results and symptoms, medications doses, her options of short and long term treatment based on the latest standards of care / guidelines;   and documenting the encounter.  Marlaysia D Kelner  participated in the discussions, expressed understanding, and voiced agreement with the above plans.  All questions were answered to her satisfaction. she is encouraged to contact clinic should she have any questions or concerns prior to her return visit.    Follow up plan: Return in about 1 year (around 01/18/2025) for F/U with Pre-visit Labs.  Marquis Lunch, MD Phone: [phone]  Fax: [phone]  -  This note was partially dictated with voice recognition software. Similar sounding words can be  transcribed inadequately or may not  be corrected upon review.  01/19/2024, 10:53 AM

## 2024-02-06 DIAGNOSIS — E0590 Thyrotoxicosis, unspecified without thyrotoxic crisis or storm: Secondary | ICD-10-CM | POA: Diagnosis not present

## 2024-02-06 DIAGNOSIS — R7303 Prediabetes: Secondary | ICD-10-CM | POA: Diagnosis not present

## 2024-02-06 DIAGNOSIS — I10 Essential (primary) hypertension: Secondary | ICD-10-CM | POA: Diagnosis not present

## 2024-02-06 DIAGNOSIS — E559 Vitamin D deficiency, unspecified: Secondary | ICD-10-CM | POA: Diagnosis not present

## 2024-02-06 DIAGNOSIS — E782 Mixed hyperlipidemia: Secondary | ICD-10-CM | POA: Diagnosis not present

## 2024-02-12 DIAGNOSIS — F329 Major depressive disorder, single episode, unspecified: Secondary | ICD-10-CM | POA: Diagnosis not present

## 2024-02-12 DIAGNOSIS — E782 Mixed hyperlipidemia: Secondary | ICD-10-CM | POA: Diagnosis not present

## 2024-02-12 DIAGNOSIS — Z85850 Personal history of malignant neoplasm of thyroid: Secondary | ICD-10-CM | POA: Diagnosis not present

## 2024-02-12 DIAGNOSIS — F633 Trichotillomania: Secondary | ICD-10-CM | POA: Diagnosis not present

## 2024-02-12 DIAGNOSIS — M542 Cervicalgia: Secondary | ICD-10-CM | POA: Diagnosis not present

## 2024-02-12 DIAGNOSIS — F419 Anxiety disorder, unspecified: Secondary | ICD-10-CM | POA: Diagnosis not present

## 2024-02-12 DIAGNOSIS — I10 Essential (primary) hypertension: Secondary | ICD-10-CM | POA: Diagnosis not present

## 2024-02-12 DIAGNOSIS — D72828 Other elevated white blood cell count: Secondary | ICD-10-CM | POA: Diagnosis not present

## 2024-02-12 DIAGNOSIS — E669 Obesity, unspecified: Secondary | ICD-10-CM | POA: Diagnosis not present

## 2024-02-12 DIAGNOSIS — R7303 Prediabetes: Secondary | ICD-10-CM | POA: Diagnosis not present

## 2024-02-12 DIAGNOSIS — E039 Hypothyroidism, unspecified: Secondary | ICD-10-CM | POA: Diagnosis not present

## 2024-02-12 DIAGNOSIS — E559 Vitamin D deficiency, unspecified: Secondary | ICD-10-CM | POA: Diagnosis not present

## 2024-02-16 ENCOUNTER — Encounter (HOSPITAL_COMMUNITY): Payer: Self-pay | Admitting: Nurse Practitioner

## 2024-02-17 ENCOUNTER — Other Ambulatory Visit (HOSPITAL_COMMUNITY): Payer: Self-pay | Admitting: Nurse Practitioner

## 2024-02-17 DIAGNOSIS — N63 Unspecified lump in unspecified breast: Secondary | ICD-10-CM

## 2024-02-27 DIAGNOSIS — Z1211 Encounter for screening for malignant neoplasm of colon: Secondary | ICD-10-CM | POA: Diagnosis not present

## 2024-03-04 LAB — EXTERNAL GENERIC LAB PROCEDURE: COLOGUARD: NEGATIVE

## 2024-03-04 LAB — COLOGUARD: COLOGUARD: NEGATIVE

## 2024-03-18 ENCOUNTER — Ambulatory Visit (HOSPITAL_COMMUNITY)

## 2024-03-18 ENCOUNTER — Encounter (HOSPITAL_COMMUNITY)

## 2024-04-13 ENCOUNTER — Ambulatory Visit (HOSPITAL_COMMUNITY)
Admission: RE | Admit: 2024-04-13 | Discharge: 2024-04-13 | Disposition: A | Discharge status: Home / Self Care | Source: Ambulatory Visit | Attending: Nurse Practitioner | Admitting: Nurse Practitioner

## 2024-04-13 DIAGNOSIS — N63 Unspecified lump in unspecified breast: Secondary | ICD-10-CM

## 2024-04-13 DIAGNOSIS — R928 Other abnormal and inconclusive findings on diagnostic imaging of breast: Secondary | ICD-10-CM | POA: Diagnosis not present

## 2024-04-13 DIAGNOSIS — R92323 Mammographic fibroglandular density, bilateral breasts: Secondary | ICD-10-CM | POA: Diagnosis not present

## 2024-04-13 DIAGNOSIS — N6315 Unspecified lump in the right breast, overlapping quadrants: Secondary | ICD-10-CM | POA: Insufficient documentation

## 2024-07-29 DIAGNOSIS — R7303 Prediabetes: Secondary | ICD-10-CM | POA: Diagnosis not present

## 2024-07-29 DIAGNOSIS — E0590 Thyrotoxicosis, unspecified without thyrotoxic crisis or storm: Secondary | ICD-10-CM | POA: Diagnosis not present

## 2024-08-06 DIAGNOSIS — Z85850 Personal history of malignant neoplasm of thyroid: Secondary | ICD-10-CM | POA: Diagnosis not present

## 2024-08-06 DIAGNOSIS — R7303 Prediabetes: Secondary | ICD-10-CM | POA: Diagnosis not present

## 2024-08-06 DIAGNOSIS — F633 Trichotillomania: Secondary | ICD-10-CM | POA: Diagnosis not present

## 2024-08-06 DIAGNOSIS — M545 Low back pain, unspecified: Secondary | ICD-10-CM | POA: Diagnosis not present

## 2024-08-06 DIAGNOSIS — F17200 Nicotine dependence, unspecified, uncomplicated: Secondary | ICD-10-CM | POA: Diagnosis not present

## 2024-08-06 DIAGNOSIS — I10 Essential (primary) hypertension: Secondary | ICD-10-CM | POA: Diagnosis not present

## 2024-08-06 DIAGNOSIS — F329 Major depressive disorder, single episode, unspecified: Secondary | ICD-10-CM | POA: Diagnosis not present

## 2024-08-06 DIAGNOSIS — F419 Anxiety disorder, unspecified: Secondary | ICD-10-CM | POA: Diagnosis not present

## 2024-08-06 DIAGNOSIS — E669 Obesity, unspecified: Secondary | ICD-10-CM | POA: Diagnosis not present

## 2024-08-06 DIAGNOSIS — E039 Hypothyroidism, unspecified: Secondary | ICD-10-CM | POA: Diagnosis not present

## 2024-08-06 DIAGNOSIS — M542 Cervicalgia: Secondary | ICD-10-CM | POA: Diagnosis not present

## 2024-08-06 DIAGNOSIS — E782 Mixed hyperlipidemia: Secondary | ICD-10-CM | POA: Diagnosis not present

## 2024-09-22 DIAGNOSIS — Z23 Encounter for immunization: Secondary | ICD-10-CM | POA: Diagnosis not present

## 2024-11-15 DIAGNOSIS — E559 Vitamin D deficiency, unspecified: Secondary | ICD-10-CM | POA: Diagnosis not present

## 2024-11-15 DIAGNOSIS — R7301 Impaired fasting glucose: Secondary | ICD-10-CM | POA: Diagnosis not present

## 2024-11-15 DIAGNOSIS — I10 Essential (primary) hypertension: Secondary | ICD-10-CM | POA: Diagnosis not present

## 2024-12-21 ENCOUNTER — Other Ambulatory Visit (HOSPITAL_COMMUNITY): Payer: Self-pay | Admitting: Nurse Practitioner

## 2024-12-21 DIAGNOSIS — R161 Splenomegaly, not elsewhere classified: Secondary | ICD-10-CM

## 2024-12-28 ENCOUNTER — Ambulatory Visit (HOSPITAL_COMMUNITY)
Admission: RE | Admit: 2024-12-28 | Discharge: 2024-12-28 | Disposition: A | Discharge status: Home / Self Care | Source: Ambulatory Visit | Attending: Nurse Practitioner | Admitting: Nurse Practitioner

## 2024-12-28 ENCOUNTER — Other Ambulatory Visit (HOSPITAL_COMMUNITY): Payer: Self-pay | Admitting: Nurse Practitioner

## 2024-12-28 DIAGNOSIS — R161 Splenomegaly, not elsewhere classified: Secondary | ICD-10-CM

## 2024-12-28 MED ORDER — GADOBUTROL 1 MMOL/ML IV SOLN
7.0000 mL | Freq: Once | INTRAVENOUS | Status: AC | PRN
  Administered 2024-12-28: 7 mL via INTRAVENOUS

## 2025-01-09 ENCOUNTER — Other Ambulatory Visit: Payer: Self-pay | Admitting: "Endocrinology

## 2025-01-11 ENCOUNTER — Other Ambulatory Visit: Payer: Self-pay

## 2025-01-11 DIAGNOSIS — Z85850 Personal history of malignant neoplasm of thyroid: Secondary | ICD-10-CM

## 2025-01-11 DIAGNOSIS — E890 Postprocedural hypothyroidism: Secondary | ICD-10-CM

## 2025-01-18 ENCOUNTER — Ambulatory Visit: Payer: Medicare Other | Admitting: "Endocrinology
